# Patient Record
Sex: Male | Born: 1960 | Race: White | Hispanic: No | Marital: Married | Smoking: Never smoker
Health system: Southern US, Community
[De-identification: ages and names within clinical notes are randomized; demographics above are authoritative.]

## PROBLEM LIST (undated history)

## (undated) DIAGNOSIS — E119 Type 2 diabetes mellitus without complications: Secondary | ICD-10-CM

## (undated) DIAGNOSIS — I10 Essential (primary) hypertension: Secondary | ICD-10-CM

## (undated) DIAGNOSIS — E785 Hyperlipidemia, unspecified: Secondary | ICD-10-CM

## (undated) DIAGNOSIS — R809 Proteinuria, unspecified: Secondary | ICD-10-CM

## (undated) HISTORY — DX: Proteinuria, unspecified: R80.9

## (undated) HISTORY — PX: VASECTOMY: SHX75

## (undated) HISTORY — DX: Type 2 diabetes mellitus without complications: E11.9

## (undated) HISTORY — PX: WISDOM TOOTH EXTRACTION: SHX21

## (undated) HISTORY — DX: Hyperlipidemia, unspecified: E78.5

## (undated) HISTORY — DX: Essential (primary) hypertension: I10

---

## 1998-09-29 ENCOUNTER — Encounter: Admission: RE | Admit: 1998-09-29 | Discharge: 1998-12-28 | Payer: Self-pay | Admitting: Family Medicine

## 2009-10-07 ENCOUNTER — Ambulatory Visit (HOSPITAL_COMMUNITY): Admission: RE | Admit: 2009-10-07 | Discharge: 2009-10-07 | Payer: Self-pay | Admitting: Family Medicine

## 2011-01-29 ENCOUNTER — Emergency Department (HOSPITAL_COMMUNITY)
Admission: EM | Admit: 2011-01-29 | Discharge: 2011-01-29 | Disposition: A | Discharge status: Home / Self Care | Payer: 59 | Attending: Emergency Medicine | Admitting: Emergency Medicine

## 2011-01-29 ENCOUNTER — Emergency Department (HOSPITAL_COMMUNITY): Payer: 59

## 2011-01-29 DIAGNOSIS — M549 Dorsalgia, unspecified: Secondary | ICD-10-CM | POA: Insufficient documentation

## 2011-01-29 DIAGNOSIS — M545 Low back pain, unspecified: Secondary | ICD-10-CM | POA: Insufficient documentation

## 2011-01-29 DIAGNOSIS — M5124 Other intervertebral disc displacement, thoracic region: Secondary | ICD-10-CM | POA: Insufficient documentation

## 2011-01-29 DIAGNOSIS — Z79899 Other long term (current) drug therapy: Secondary | ICD-10-CM | POA: Insufficient documentation

## 2011-01-29 DIAGNOSIS — E119 Type 2 diabetes mellitus without complications: Secondary | ICD-10-CM | POA: Insufficient documentation

## 2011-01-29 DIAGNOSIS — I10 Essential (primary) hypertension: Secondary | ICD-10-CM | POA: Insufficient documentation

## 2011-01-29 DIAGNOSIS — E7800 Pure hypercholesterolemia, unspecified: Secondary | ICD-10-CM | POA: Insufficient documentation

## 2011-01-29 DIAGNOSIS — M5126 Other intervertebral disc displacement, lumbar region: Secondary | ICD-10-CM | POA: Insufficient documentation

## 2011-01-29 DIAGNOSIS — R112 Nausea with vomiting, unspecified: Secondary | ICD-10-CM | POA: Insufficient documentation

## 2011-01-29 LAB — COMPREHENSIVE METABOLIC PANEL
BUN: 10 mg/dL (ref 6–23)
CO2: 24 mEq/L (ref 19–32)
Calcium: 8.9 mg/dL (ref 8.4–10.5)
Creatinine, Ser: 0.73 mg/dL (ref 0.4–1.5)
GFR calc non Af Amer: >60 mL/min (ref >60)
Glucose, Bld: 197 mg/dL — ABNORMAL HIGH (ref 70–99)

## 2011-01-29 LAB — DIFFERENTIAL
Lymphocytes Relative: 13 % (ref 12–46)
Monocytes Absolute: 0.3 10*3/uL (ref 0.1–1.0)
Monocytes Relative: 4 % (ref 3–12)
Neutro Abs: 6.5 10*3/uL (ref 1.7–7.7)

## 2011-01-29 LAB — URINALYSIS, ROUTINE W REFLEX MICROSCOPIC
Ketones, ur: NEGATIVE mg/dL
Leukocytes, UA: NEGATIVE
Nitrite: NEGATIVE
Specific Gravity, Urine: >1.03 — ABNORMAL HIGH (ref 1.005–1.030)
pH: 5.5 (ref 5.0–8.0)

## 2011-01-29 LAB — CBC
HCT: 39.2 % (ref 39.0–52.0)
Hemoglobin: 13.5 g/dL (ref 13.0–17.0)
MCH: 29.5 pg (ref 26.0–34.0)
MCHC: 34.4 g/dL (ref 30.0–36.0)

## 2013-01-15 ENCOUNTER — Other Ambulatory Visit: Payer: Self-pay | Admitting: *Deleted

## 2013-01-15 MED ORDER — GLYBURIDE 5 MG PO TABS: 5.0000 mg | ORAL_TABLET | Freq: Every day | ORAL | Status: DC

## 2013-01-15 MED ORDER — METFORMIN HCL 500 MG PO TABS: 1000.0000 mg | ORAL_TABLET | Freq: Two times a day (BID) | ORAL | Status: DC

## 2013-02-21 ENCOUNTER — Other Ambulatory Visit: Payer: Self-pay | Admitting: Family Medicine

## 2013-03-19 ENCOUNTER — Other Ambulatory Visit: Payer: Self-pay | Admitting: Family Medicine

## 2013-07-21 ENCOUNTER — Other Ambulatory Visit: Payer: Self-pay | Admitting: Family Medicine

## 2013-07-25 ENCOUNTER — Other Ambulatory Visit: Payer: Self-pay | Admitting: Family Medicine

## 2013-08-29 ENCOUNTER — Encounter: Payer: Self-pay | Admitting: *Deleted

## 2013-09-01 ENCOUNTER — Encounter: Payer: Self-pay | Admitting: Family Medicine

## 2013-09-01 ENCOUNTER — Ambulatory Visit (INDEPENDENT_AMBULATORY_CARE_PROVIDER_SITE_OTHER): Payer: BC Managed Care – PPO | Admitting: Family Medicine

## 2013-09-01 VITALS — BP 138/88 | Ht 70.5 in | Wt 191.6 lb

## 2013-09-01 DIAGNOSIS — Z79899 Other long term (current) drug therapy: Secondary | ICD-10-CM

## 2013-09-01 DIAGNOSIS — E782 Mixed hyperlipidemia: Secondary | ICD-10-CM | POA: Insufficient documentation

## 2013-09-01 DIAGNOSIS — E119 Type 2 diabetes mellitus without complications: Secondary | ICD-10-CM | POA: Insufficient documentation

## 2013-09-01 DIAGNOSIS — I10 Essential (primary) hypertension: Secondary | ICD-10-CM | POA: Insufficient documentation

## 2013-09-01 DIAGNOSIS — Z125 Encounter for screening for malignant neoplasm of prostate: Secondary | ICD-10-CM

## 2013-09-01 DIAGNOSIS — E1129 Type 2 diabetes mellitus with other diabetic kidney complication: Secondary | ICD-10-CM

## 2013-09-01 DIAGNOSIS — E785 Hyperlipidemia, unspecified: Secondary | ICD-10-CM | POA: Insufficient documentation

## 2013-09-01 DIAGNOSIS — R809 Proteinuria, unspecified: Secondary | ICD-10-CM

## 2013-09-01 DIAGNOSIS — IMO0001 Reserved for inherently not codable concepts without codable children: Secondary | ICD-10-CM

## 2013-09-01 LAB — HEPATIC FUNCTION PANEL
ALT: 22 U/L (ref 0–53)
Bilirubin, Direct: 0.2 mg/dL (ref 0.0–0.3)
Indirect Bilirubin: 0.4 mg/dL (ref 0.0–0.9)

## 2013-09-01 LAB — POCT GLYCOSYLATED HEMOGLOBIN (HGB A1C): Hemoglobin A1C: 6.5

## 2013-09-01 LAB — BASIC METABOLIC PANEL
BUN: 21 mg/dL (ref 6–23)
CO2: 26 mEq/L (ref 19–32)
Glucose, Bld: 178 mg/dL — ABNORMAL HIGH (ref 70–99)
Potassium: 4.7 mEq/L (ref 3.5–5.3)

## 2013-09-01 LAB — LIPID PANEL
Cholesterol: 101 mg/dL (ref 0–200)
VLDL: 11 mg/dL (ref 0–40)

## 2013-09-01 MED ORDER — ATORVASTATIN CALCIUM 40 MG PO TABS: ORAL_TABLET | ORAL | Status: DC

## 2013-09-01 MED ORDER — METFORMIN HCL 500 MG PO TABS: 1000.0000 mg | ORAL_TABLET | Freq: Two times a day (BID) | ORAL | Status: DC

## 2013-09-01 MED ORDER — GLYBURIDE 5 MG PO TABS: ORAL_TABLET | ORAL | Status: DC

## 2013-09-01 MED ORDER — ENALAPRIL MALEATE 20 MG PO TABS: ORAL_TABLET | ORAL | Status: DC

## 2013-09-01 NOTE — Progress Notes (Signed)
  Subjective:    Patient ID: Grant Torres, male    DOB: [birth year], 52 y.o.   MRN: [id number]  Diabetes He has type 2 diabetes mellitus. His disease course has been improving. Pertinent negatives for diabetes include no blurred vision and no chest pain. Symptoms are stable. There are no diabetic complications. Risk factors for coronary artery disease include diabetes mellitus, hypertension and male sex. Current diabetic treatment includes oral agent (monotherapy). He is compliant with treatment all of the time. He is following a generally healthy diet. Meal planning includes avoidance of concentrated sweets. He participates in exercise intermittently. There is no change in his home blood glucose trend. His breakfast blood glucose is taken between 7-8 am. His breakfast blood glucose range is generally 90-110 mg/dl. An ACE inhibitor/angiotensin II receptor blocker is being taken. Eye exam is not current.   Patient is here today for diabetic check up. He states his blood sugars are WNL.  Patient claims compliance with his blood pressure medication. Does not check his blood pressure elsewhere. Watching his salt intake. No obvious side effects from the medicine. Not exercising much except at work.  Compliant with lipid medications. No obvious side effects. Watching his diet fairly well. Eating healthy.  History of micro-proteinuria.  He needs a refill on meds.   Results for orders placed in visit on 09/01/13  POCT GLYCOSYLATED HEMOGLOBIN (HGB A1C)      Result Value Range   Hemoglobin A1C 6.5        Review of Systems  Eyes: Negative for blurred vision.  Cardiovascular: Negative for chest pain.   no abdominal pain no back pain no numbness or tingling. No frequent urination. No headache no chest pain ROS otherwise negative     Objective:   Physical Exam  Alert HEENT normal. Lungs clear. Heart regular rate and rhythm. Ankles without edema. See diabetic foot exam      Assessment & Plan:   Impression #1 type 2 diabetes control good. #2 hypertension controlled good. #3 hyperlipidemia status uncertain. #4 micro-proteinuria status uncertain. Plan already has had flu shot. Exercise encourage. Diet discussed. See eye Dr. Walking is exam in 3 months. Appropriate blood work. WSL

## 2013-09-02 LAB — PSA: PSA: 1.37 ng/mL (ref <=4.00)

## 2013-09-11 ENCOUNTER — Encounter: Payer: Self-pay | Admitting: Family Medicine

## 2013-12-07 ENCOUNTER — Encounter: Payer: Self-pay | Admitting: Family Medicine

## 2013-12-07 ENCOUNTER — Ambulatory Visit (INDEPENDENT_AMBULATORY_CARE_PROVIDER_SITE_OTHER): Payer: BC Managed Care – PPO | Admitting: Family Medicine

## 2013-12-07 VITALS — BP 130/78 | Ht 70.5 in | Wt 191.8 lb

## 2013-12-07 DIAGNOSIS — Z79899 Other long term (current) drug therapy: Secondary | ICD-10-CM

## 2013-12-07 DIAGNOSIS — E789 Disorder of lipoprotein metabolism, unspecified: Secondary | ICD-10-CM

## 2013-12-07 DIAGNOSIS — Z0000 Encounter for general adult medical examination without abnormal findings: Secondary | ICD-10-CM

## 2013-12-07 DIAGNOSIS — E782 Mixed hyperlipidemia: Secondary | ICD-10-CM

## 2013-12-07 NOTE — Progress Notes (Signed)
   Subjective:    Patient ID: Grant Torres, male    DOB: 1961-03-20, 53 y.o.   MRN: [id number]  HPI  Patient arrives for a yearly physical. Patient reports no problems or concerns.  stying very busy, working sixty hrs per week  Overtime is very significant,business high volume currently  Likely got a pneum booster, pretty  Sure go one  Gets flu shots regularly,  No close fam hx of colon ca or prostate ca  No colonospy yet,  Exercised with tennis and b ball in thre past, wife works at [employer] center, no exercise equip    Review of Systems  Constitutional: Negative for fever, activity change and appetite change.  HENT: Negative for congestion and rhinorrhea.   Eyes: Negative for discharge.  Respiratory: Negative for cough and wheezing.   Cardiovascular: Negative for chest pain.  Gastrointestinal: Negative for vomiting, abdominal pain and blood in stool.  Genitourinary: Negative for frequency and difficulty urinating.  Musculoskeletal: Negative for neck pain.  Skin: Negative for rash.  Allergic/Immunologic: Negative for environmental allergies and food allergies.  Neurological: Negative for weakness and headaches.  Psychiatric/Behavioral: Negative for agitation.       Objective:   Physical Exam  Constitutional: He appears well-developed and well-nourished.  HENT:  Head: Normocephalic and atraumatic.  Right Ear: External ear normal.  Left Ear: External ear normal.  Nose: Nose normal.  Mouth/Throat: Oropharynx is clear and moist.  Eyes: EOM are normal. Pupils are equal, round, and reactive to light.  Neck: Normal range of motion. Neck supple. No thyromegaly present.  Cardiovascular: Normal rate, regular rhythm and normal heart sounds.   No murmur heard. Pulmonary/Chest: Effort normal and breath sounds normal. No respiratory distress. He has no wheezes.  Abdominal: Soft. Bowel sounds are normal. He exhibits no distension and no mass. There is no tenderness.    Genitourinary: Prostate normal and penis normal.  Musculoskeletal: Normal range of motion. He exhibits no edema.  Lymphadenopathy:    He has no cervical adenopathy.  Neurological: He is alert. He exhibits normal muscle tone.  Skin: Skin is warm and dry. No erythema.  Psychiatric: He has a normal mood and affect. His behavior is normal. Judgment normal.          Assessment & Plan:  Impression 1 wellness exam #2 hypertension #3 type 2 diabetes #4 hyperlipidemia plan diet discussed at length. Exercise discussed length. Patient reports he just had another pneumonia shot several years ago in [place]. GI sheet given patient advised to call and set up colonoscopy. Recheck in several months. WSL

## 2014-03-09 ENCOUNTER — Ambulatory Visit (INDEPENDENT_AMBULATORY_CARE_PROVIDER_SITE_OTHER): Payer: BC Managed Care – PPO | Admitting: Family Medicine

## 2014-03-09 ENCOUNTER — Encounter: Payer: Self-pay | Admitting: Family Medicine

## 2014-03-09 VITALS — BP 128/74 | Ht 70.5 in | Wt 193.0 lb

## 2014-03-09 DIAGNOSIS — E1165 Type 2 diabetes mellitus with hyperglycemia: Secondary | ICD-10-CM

## 2014-03-09 DIAGNOSIS — R809 Proteinuria, unspecified: Secondary | ICD-10-CM

## 2014-03-09 DIAGNOSIS — E119 Type 2 diabetes mellitus without complications: Secondary | ICD-10-CM

## 2014-03-09 DIAGNOSIS — I10 Essential (primary) hypertension: Secondary | ICD-10-CM

## 2014-03-09 DIAGNOSIS — IMO0002 Reserved for concepts with insufficient information to code with codable children: Secondary | ICD-10-CM

## 2014-03-09 DIAGNOSIS — E118 Type 2 diabetes mellitus with unspecified complications: Secondary | ICD-10-CM

## 2014-03-09 DIAGNOSIS — E785 Hyperlipidemia, unspecified: Secondary | ICD-10-CM

## 2014-03-09 LAB — POCT GLYCOSYLATED HEMOGLOBIN (HGB A1C): HEMOGLOBIN A1C: 7.8

## 2014-03-09 MED ORDER — METFORMIN HCL 500 MG PO TABS: 1000.0000 mg | ORAL_TABLET | Freq: Two times a day (BID) | ORAL | Status: DC

## 2014-03-09 MED ORDER — ATORVASTATIN CALCIUM 40 MG PO TABS: ORAL_TABLET | ORAL | Status: DC

## 2014-03-09 MED ORDER — GLYBURIDE 5 MG PO TABS: ORAL_TABLET | ORAL | Status: DC

## 2014-03-09 MED ORDER — ENALAPRIL MALEATE 20 MG PO TABS: ORAL_TABLET | ORAL | Status: DC

## 2014-03-09 NOTE — Progress Notes (Signed)
   Subjective:    Patient ID: Grant Torres, male    DOB: 10/14/1961, 53 y.o.   MRN: [id number]  Diabetes He presents for his follow-up diabetic visit. He has type 2 diabetes mellitus. He is compliant with treatment all of the time. Exercise: walks two days a week. His breakfast blood glucose range is generally 110-130 mg/dl. He does not see a podiatrist.Eye exam is current.  no low sugar levels Results for orders placed in visit on 03/09/14  POCT GLYCOSYLATED HEMOGLOBIN (HGB A1C)      Result Value Ref Range   Hemoglobin A1C 7.8      Numb about the same 110 to 120, somewhere in there. cks bef eating  Sticking with  Exercise walking couple days per wk  Works long hours but off and on,  Eats lighter during the summer  Couple bouts of head cold and cong   Compliant with lipid medications. No obvious side effects. Sticking with his diet mostly. But not perfectly.  Compliant with blood pressure medicine. No obvious side effects. Generally does not miss a dose.  No significant low sugar spells.    Review of Systems No chest pain no headache no back pain no weight loss weight gain ROS otherwise negative    Objective:   Physical Exam Alert no apparent distress HEENT normal neck supple. Lungs clear heart regular in rhythm. Ankles without edema.       Assessment & Plan:  Impression 1 type 2 diabetes suboptimum discuss. #2 hypertension good control. #3 hyperlipidemia status uncertain. Plan diet exercise discussed. Increase dose of glyburide. Yearly eye Dr. visits encourage. Medications refilled. Appropriate blood work. Further recommendations based results. WSL

## 2014-03-10 LAB — LIPID PANEL
CHOLESTEROL: 100 mg/dL (ref 0–200)
HDL: 31 mg/dL — ABNORMAL LOW (ref >39)
LDL Cholesterol: 45 mg/dL (ref 0–99)
Total CHOL/HDL Ratio: 3.2 Ratio
Triglycerides: 119 mg/dL (ref <150)
VLDL: 24 mg/dL (ref 0–40)

## 2014-03-10 LAB — HEPATIC FUNCTION PANEL
ALK PHOS: 52 U/L (ref 39–117)
ALT: 24 U/L (ref 0–53)
AST: 20 U/L (ref 0–37)
Albumin: 4.2 g/dL (ref 3.5–5.2)
BILIRUBIN DIRECT: 0.1 mg/dL (ref 0.0–0.3)
BILIRUBIN TOTAL: 0.5 mg/dL (ref 0.2–1.2)
Indirect Bilirubin: 0.4 mg/dL (ref 0.2–1.2)
Total Protein: 6.6 g/dL (ref 6.0–8.3)

## 2014-03-12 ENCOUNTER — Other Ambulatory Visit: Payer: Self-pay | Admitting: Family Medicine

## 2014-03-14 ENCOUNTER — Encounter: Payer: Self-pay | Admitting: Family Medicine

## 2014-09-08 ENCOUNTER — Other Ambulatory Visit: Payer: Self-pay | Admitting: Family Medicine

## 2014-10-04 ENCOUNTER — Other Ambulatory Visit: Payer: Self-pay | Admitting: Family Medicine

## 2014-10-10 ENCOUNTER — Other Ambulatory Visit: Payer: Self-pay | Admitting: Family Medicine

## 2014-10-20 ENCOUNTER — Ambulatory Visit (INDEPENDENT_AMBULATORY_CARE_PROVIDER_SITE_OTHER): Payer: BC Managed Care – PPO | Admitting: Family Medicine

## 2014-10-20 ENCOUNTER — Encounter: Payer: Self-pay | Admitting: Family Medicine

## 2014-10-20 ENCOUNTER — Encounter (INDEPENDENT_AMBULATORY_CARE_PROVIDER_SITE_OTHER): Payer: Self-pay

## 2014-10-20 VITALS — BP 130/80 | Ht 70.5 in | Wt 194.4 lb

## 2014-10-20 DIAGNOSIS — E119 Type 2 diabetes mellitus without complications: Secondary | ICD-10-CM

## 2014-10-20 DIAGNOSIS — Z125 Encounter for screening for malignant neoplasm of prostate: Secondary | ICD-10-CM

## 2014-10-20 DIAGNOSIS — E785 Hyperlipidemia, unspecified: Secondary | ICD-10-CM

## 2014-10-20 DIAGNOSIS — Z79899 Other long term (current) drug therapy: Secondary | ICD-10-CM

## 2014-10-20 LAB — HEPATIC FUNCTION PANEL
ALT: 36 U/L (ref 0–53)
AST: 26 U/L (ref 0–37)
Albumin: 4.4 g/dL (ref 3.5–5.2)
Alkaline Phosphatase: 56 U/L (ref 39–117)
BILIRUBIN DIRECT: 0.2 mg/dL (ref 0.0–0.3)
Indirect Bilirubin: 0.4 mg/dL (ref 0.2–1.2)
Total Bilirubin: 0.6 mg/dL (ref 0.2–1.2)
Total Protein: 7 g/dL (ref 6.0–8.3)

## 2014-10-20 LAB — BASIC METABOLIC PANEL
BUN: 15 mg/dL (ref 6–23)
CO2: 27 mEq/L (ref 19–32)
Calcium: 9.5 mg/dL (ref 8.4–10.5)
Chloride: 100 mEq/L (ref 96–112)
Creat: 0.77 mg/dL (ref 0.50–1.35)
Glucose, Bld: 224 mg/dL — ABNORMAL HIGH (ref 70–99)
POTASSIUM: 4.3 meq/L (ref 3.5–5.3)
SODIUM: 140 meq/L (ref 135–145)

## 2014-10-20 LAB — POCT GLYCOSYLATED HEMOGLOBIN (HGB A1C): Hemoglobin A1C: 8

## 2014-10-20 LAB — LIPID PANEL
CHOL/HDL RATIO: 2.9 ratio
Cholesterol: 97 mg/dL (ref 0–200)
HDL: 33 mg/dL — ABNORMAL LOW (ref >39)
LDL Cholesterol: 44 mg/dL (ref 0–99)
TRIGLYCERIDES: 98 mg/dL (ref <150)
VLDL: 20 mg/dL (ref 0–40)

## 2014-10-20 MED ORDER — ENALAPRIL MALEATE 20 MG PO TABS: 20.0000 mg | ORAL_TABLET | Freq: Every day | ORAL | Status: DC

## 2014-10-20 MED ORDER — METFORMIN HCL 500 MG PO TABS: ORAL_TABLET | ORAL | Status: DC

## 2014-10-20 MED ORDER — ATORVASTATIN CALCIUM 40 MG PO TABS: 20.0000 mg | ORAL_TABLET | Freq: Every day | ORAL | Status: DC

## 2014-10-20 MED ORDER — GLYBURIDE 5 MG PO TABS
ORAL_TABLET | ORAL | Status: DC
Start: 2014-10-20 — End: 2014-11-11

## 2014-10-20 NOTE — Progress Notes (Signed)
   Subjective:    Patient ID: Grant Torres, male    DOB: 1961/03/04, 53 y.o.   MRN: [id number]  Diabetes He presents for his follow-up diabetic visit. He has type 2 diabetes mellitus. His disease course has been stable. There are no hypoglycemic associated symptoms. There are no diabetic associated symptoms. There are no hypoglycemic complications. Symptoms are stable. There are no diabetic complications. There are no known risk factors for coronary artery disease. Current diabetic treatment includes oral agent (dual therapy). He is compliant with treatment all of the time.   Patient states that he has had a persistent cough for over 1 week now.   Sig cough worse at night  occas productive, more like a tickle than a bad chest cough  Walking at work  Diet fairly tight, not too much indulgence, eats a lot of salad  Results for orders placed or performed in visit on 10/20/14  POCT glycosylated hemoglobin (Hb A1C)  Result Value Ref Range   Hemoglobin A1C 8.0   most morn numb are a110 to 120  Eye doc soon in [place] with blood pressure medication. Does not miss a dose. Watching salt intake. Unfortunately not exercising.  Compliant with lipid medicine. No obvious side effects. Has watch cholesterol on diet  Slight cough no headache no fever no chills on week's duration   . Review of Systems  no headache no chest pain no back pain no abdominal pain no change in bowel habits no blood in stool ROS otherwise negative    Objective:   Physical Exam   alert no acute distress blood pressure good on repeat HEENT sinus congestion frontal neck supple lungs clear. Heart regular in rhythm. C diabetic foot exam       Assessment & Plan:   Impression type 2 diabetes control suboptimal discussed #2 hypertension good control. #3 hyperlipidemia status uncertain. #4 URI plan increase DiaBeta diabetic rationale discussed appropriate blood work. Check in 6 months we'll do wellness plus diabetes  visit if his insurance allows for this. Maintain other meds. Further recommendations based on lipid result. If no improvement and cough can call for antibiotics next week WSL

## 2014-10-21 LAB — PSA: PSA: 2.23 ng/mL (ref <=4.00)

## 2014-10-21 LAB — MICROALBUMIN, URINE: Microalb, Ur: 3.6 mg/dL — ABNORMAL HIGH (ref <2.0)

## 2014-10-24 ENCOUNTER — Encounter: Payer: Self-pay | Admitting: Family Medicine

## 2014-11-11 ENCOUNTER — Other Ambulatory Visit: Payer: Self-pay | Admitting: Family Medicine

## 2015-01-26 ENCOUNTER — Other Ambulatory Visit: Payer: Self-pay | Admitting: Family Medicine

## 2015-01-27 MED ORDER — GLYBURIDE 5 MG PO TABS: ORAL_TABLET | ORAL | Status: DC

## 2015-01-27 NOTE — Addendum Note (Signed)
Addended byCharolotte Capuchin D on: 01/27/2015 10:48 AM   Modules accepted: Orders

## 2015-01-30 ENCOUNTER — Other Ambulatory Visit: Payer: Self-pay | Admitting: Family Medicine

## 2015-04-21 ENCOUNTER — Ambulatory Visit (INDEPENDENT_AMBULATORY_CARE_PROVIDER_SITE_OTHER): Payer: BLUE CROSS/BLUE SHIELD | Admitting: Family Medicine

## 2015-04-21 ENCOUNTER — Encounter: Payer: Self-pay | Admitting: Family Medicine

## 2015-04-21 VITALS — BP 120/80 | Ht 70.5 in | Wt 194.2 lb

## 2015-04-21 DIAGNOSIS — E119 Type 2 diabetes mellitus without complications: Secondary | ICD-10-CM | POA: Diagnosis not present

## 2015-04-21 DIAGNOSIS — E785 Hyperlipidemia, unspecified: Secondary | ICD-10-CM | POA: Diagnosis not present

## 2015-04-21 DIAGNOSIS — Z79899 Other long term (current) drug therapy: Secondary | ICD-10-CM | POA: Diagnosis not present

## 2015-04-21 DIAGNOSIS — I10 Essential (primary) hypertension: Secondary | ICD-10-CM | POA: Diagnosis not present

## 2015-04-21 LAB — POCT GLYCOSYLATED HEMOGLOBIN (HGB A1C): Hemoglobin A1C: 7.9

## 2015-04-21 MED ORDER — METFORMIN HCL 500 MG PO TABS: ORAL_TABLET | ORAL | Status: DC

## 2015-04-21 MED ORDER — ATORVASTATIN CALCIUM 40 MG PO TABS: 20.0000 mg | ORAL_TABLET | Freq: Every day | ORAL | Status: DC

## 2015-04-21 MED ORDER — ENALAPRIL MALEATE 20 MG PO TABS: 20.0000 mg | ORAL_TABLET | Freq: Every day | ORAL | Status: DC

## 2015-04-21 MED ORDER — GLYBURIDE 5 MG PO TABS: ORAL_TABLET | ORAL | Status: DC

## 2015-04-21 NOTE — Progress Notes (Signed)
   Subjective:    Patient ID: Grant Torres, male    DOB: 02/24/1961, 54 y.o.   MRN: [id number]  Diabetes He presents for his follow-up diabetic visit. He has type 2 diabetes mellitus. There are no hypoglycemic associated symptoms. There are no diabetic associated symptoms. There are no hypoglycemic complications. There are no diabetic complications. There are no known risk factors for coronary artery disease. Current diabetic treatment includes oral agent (dual therapy). He is compliant with treatment all of the time.   Patient states that he has no concerns at this time.   During the summer eats meds regularly  stickes with meds and   110 and 120 mostly in the morn..  No low sugars recently  No longer skipping morn meds    Results for orders placed or performed in visit on 04/21/15  POCT glycosylated hemoglobin (Hb A1C)  Result Value Ref Range   Hemoglobin A1C 7.9    Had diab visit in jan for the eyes, stable per pt.  Patient arrives office for follow-up of multiple concerns. Claims compliance with blood pressure medication. Generally does not miss. No obvious side effects. Has cut his salt intake down.  Compliant with lipid medicine. No obvious side effects.  Exercise mostly not occurring due to tremendously busy schedule   Review of Systems No headache no chest pain no back pain no abdominal pain no change in bowel habits no blood in stool    Objective:   Physical Exam  Alert vitals stable HEENT normal. Lungs clear. Heart regular in rhythm. Ankles without edema      Assessment & Plan:  Impression 1 type 2 diabetes control suboptimal in discussed #2 hypertension good control discussed #3 hyperlipidemia status uncertain discuss plan appropriate blood work. Diet exercise discussed. Recheck in 6 months for both wellness and diabetes visit. Increase glyburide to one and half tablets twice a day WSL

## 2015-04-22 LAB — HEPATIC FUNCTION PANEL
ALBUMIN: 4.6 g/dL (ref 3.5–5.5)
ALK PHOS: 59 IU/L (ref 39–117)
ALT: 31 IU/L (ref 0–44)
AST: 25 IU/L (ref 0–40)
BILIRUBIN TOTAL: 0.8 mg/dL (ref 0.0–1.2)
BILIRUBIN, DIRECT: 0.25 mg/dL (ref 0.00–0.40)
Total Protein: 7 g/dL (ref 6.0–8.5)

## 2015-04-22 LAB — LIPID PANEL
CHOL/HDL RATIO: 3.1 ratio (ref 0.0–5.0)
Cholesterol, Total: 113 mg/dL (ref 100–199)
HDL: 37 mg/dL — AB (ref >39)
LDL CALC: 50 mg/dL (ref 0–99)
TRIGLYCERIDES: 130 mg/dL (ref 0–149)
VLDL Cholesterol Cal: 26 mg/dL (ref 5–40)

## 2015-04-25 ENCOUNTER — Encounter: Payer: Self-pay | Admitting: Family Medicine

## 2015-10-04 ENCOUNTER — Other Ambulatory Visit: Payer: Self-pay | Admitting: Family Medicine

## 2015-10-26 ENCOUNTER — Encounter: Payer: Self-pay | Admitting: Family Medicine

## 2015-10-26 ENCOUNTER — Ambulatory Visit (INDEPENDENT_AMBULATORY_CARE_PROVIDER_SITE_OTHER): Payer: BLUE CROSS/BLUE SHIELD | Admitting: Family Medicine

## 2015-10-26 VITALS — BP 128/82 | Ht 70.5 in | Wt 189.4 lb

## 2015-10-26 DIAGNOSIS — E785 Hyperlipidemia, unspecified: Secondary | ICD-10-CM

## 2015-10-26 DIAGNOSIS — E119 Type 2 diabetes mellitus without complications: Secondary | ICD-10-CM | POA: Diagnosis not present

## 2015-10-26 DIAGNOSIS — Z0000 Encounter for general adult medical examination without abnormal findings: Secondary | ICD-10-CM

## 2015-10-26 DIAGNOSIS — Z125 Encounter for screening for malignant neoplasm of prostate: Secondary | ICD-10-CM

## 2015-10-26 DIAGNOSIS — Z23 Encounter for immunization: Secondary | ICD-10-CM | POA: Diagnosis not present

## 2015-10-26 DIAGNOSIS — I10 Essential (primary) hypertension: Secondary | ICD-10-CM | POA: Diagnosis not present

## 2015-10-26 DIAGNOSIS — Z79899 Other long term (current) drug therapy: Secondary | ICD-10-CM | POA: Diagnosis not present

## 2015-10-26 LAB — POCT GLYCOSYLATED HEMOGLOBIN (HGB A1C): Hemoglobin A1C: 7.5

## 2015-10-26 MED ORDER — ATORVASTATIN CALCIUM 40 MG PO TABS: 20.0000 mg | ORAL_TABLET | Freq: Every day | ORAL | Status: DC

## 2015-10-26 MED ORDER — ENALAPRIL MALEATE 20 MG PO TABS: 20.0000 mg | ORAL_TABLET | Freq: Every day | ORAL | Status: DC

## 2015-10-26 MED ORDER — GLYBURIDE 5 MG PO TABS: ORAL_TABLET | ORAL | Status: DC

## 2015-10-26 MED ORDER — METFORMIN HCL 500 MG PO TABS: ORAL_TABLET | ORAL | Status: DC

## 2015-10-26 NOTE — Progress Notes (Signed)
   Subjective:    Patient ID: Grant Torres, male    DOB: Nov 22, 1960, 54 y.o.   MRN: [id number]  HPI The patient comes in today for a wellness visit.  Has not had colonoscopy, difficult to schedule it,  rxercise so so, not the best , staying active with work   A review of their health history was completed.  A review of medications was also completed.  Any needed refills; yes-90 days  Eating habits: trying to eat good  Falls/  MVA accidents in past few months: no  Regular exercise: just active at work  Specialist pt sees on regular basis: no  Preventative health issues were discussed.   Additional concerns:none Results for orders placed or performed in visit on 10/26/15  POCT glycosylated hemoglobin (Hb A1C)  Result Value Ref Range   Hemoglobin A1C 7.5    Most numbers between 110 ish,, no sig low sugar spells lately   Eye doc sched for 21of January  Flu shot needs today   Overall diet grade around a "B"  nees b w, also .      Review of Systems  Constitutional: Negative for fever, activity change and appetite change.  HENT: Negative for congestion and rhinorrhea.   Eyes: Negative for discharge.  Respiratory: Negative for cough and wheezing.   Cardiovascular: Negative for chest pain.  Gastrointestinal: Negative for vomiting, abdominal pain and blood in stool.  Genitourinary: Negative for frequency and difficulty urinating.  Musculoskeletal: Negative for neck pain.  Skin: Negative for rash.  Allergic/Immunologic: Negative for environmental allergies and food allergies.  Neurological: Negative for weakness and headaches.  Psychiatric/Behavioral: Negative for agitation.  All other systems reviewed and are negative.      Objective:   Physical Exam  Constitutional: He appears well-developed and well-nourished.  HENT:  Head: Normocephalic and atraumatic.  Right Ear: External ear normal.  Left Ear: External ear normal.  Nose: Nose normal.  Mouth/Throat:  Oropharynx is clear and moist.  Eyes: EOM are normal. Pupils are equal, round, and reactive to light.  Neck: Normal range of motion. Neck supple. No thyromegaly present.  Cardiovascular: Normal rate, regular rhythm and normal heart sounds.   No murmur heard. Pulmonary/Chest: Effort normal and breath sounds normal. No respiratory distress. He has no wheezes.  Abdominal: Soft. Bowel sounds are normal. He exhibits no distension and no mass. There is no tenderness.  Genitourinary: Penis normal.  Musculoskeletal: Normal range of motion. He exhibits no edema.  Lymphadenopathy:    He has no cervical adenopathy.  Neurological: He is alert. He exhibits normal muscle tone.  Skin: Skin is warm and dry. No erythema.  Psychiatric: He has a normal mood and affect. His behavior is normal. Judgment normal.  Vitals reviewed.         Assessment & Plan:  Impression well adult exam. #2 type 2 diabetes control good the not perfect discussed at length #3 hypertension good control plan appropriate blood work. Flu shot. Colonoscopy sheet given strongly encouraged to get on with it. Meds reviewed. Recheck in 6 months WSL

## 2015-10-27 LAB — HEPATIC FUNCTION PANEL
ALT: 22 IU/L (ref 0–44)
AST: 19 IU/L (ref 0–40)
Albumin: 4.8 g/dL (ref 3.5–5.5)
Alkaline Phosphatase: 59 IU/L (ref 39–117)
BILIRUBIN, DIRECT: 0.16 mg/dL (ref 0.00–0.40)
Bilirubin Total: 0.5 mg/dL (ref 0.0–1.2)
TOTAL PROTEIN: 7.1 g/dL (ref 6.0–8.5)

## 2015-10-27 LAB — LIPID PANEL
CHOL/HDL RATIO: 3.1 ratio (ref 0.0–5.0)
Cholesterol, Total: 122 mg/dL (ref 100–199)
HDL: 39 mg/dL — ABNORMAL LOW (ref >39)
LDL Calculated: 63 mg/dL (ref 0–99)
Triglycerides: 100 mg/dL (ref 0–149)
VLDL CHOLESTEROL CAL: 20 mg/dL (ref 5–40)

## 2015-10-27 LAB — BASIC METABOLIC PANEL
BUN/Creatinine Ratio: 20 (ref 9–20)
BUN: 15 mg/dL (ref 6–24)
CO2: 21 mmol/L (ref 18–29)
CREATININE: 0.76 mg/dL (ref 0.76–1.27)
Calcium: 9.8 mg/dL (ref 8.7–10.2)
Chloride: 96 mmol/L (ref 96–106)
GFR calc Af Amer: 120 mL/min/{1.73_m2} (ref >59)
GFR calc non Af Amer: 104 mL/min/{1.73_m2} (ref >59)
GLUCOSE: 279 mg/dL — AB (ref 65–99)
Potassium: 4.6 mmol/L (ref 3.5–5.2)
Sodium: 141 mmol/L (ref 134–144)

## 2015-10-27 LAB — PSA: Prostate Specific Ag, Serum: 1.9 ng/mL (ref 0.0–4.0)

## 2015-10-27 LAB — MICROALBUMIN / CREATININE URINE RATIO
CREATININE, UR: 91.2 mg/dL
MICROALB/CREAT RATIO: 32 mg/g{creat} — AB (ref 0.0–30.0)
MICROALBUM., U, RANDOM: 29.2 ug/mL

## 2015-11-02 ENCOUNTER — Encounter: Payer: Self-pay | Admitting: Family Medicine

## 2015-11-02 ENCOUNTER — Ambulatory Visit (INDEPENDENT_AMBULATORY_CARE_PROVIDER_SITE_OTHER): Payer: BLUE CROSS/BLUE SHIELD | Admitting: Family Medicine

## 2015-11-02 ENCOUNTER — Telehealth: Payer: Self-pay | Admitting: Family Medicine

## 2015-11-02 VITALS — BP 118/80 | Temp 98.2°F | Ht 70.5 in | Wt 189.1 lb

## 2015-11-02 DIAGNOSIS — G542 Cervical root disorders, not elsewhere classified: Secondary | ICD-10-CM

## 2015-11-02 DIAGNOSIS — R29898 Other symptoms and signs involving the musculoskeletal system: Secondary | ICD-10-CM

## 2015-11-02 MED ORDER — HYDROCODONE-ACETAMINOPHEN 5-325 MG PO TABS: ORAL_TABLET | ORAL | Status: DC

## 2015-11-02 MED ORDER — PREDNISONE 20 MG PO TABS: ORAL_TABLET | ORAL | Status: DC

## 2015-11-02 NOTE — Telephone Encounter (Signed)
Note faxed, pt aware

## 2015-11-02 NOTE — Telephone Encounter (Signed)
That's ok, we need MRI before then and we will work on that too

## 2015-11-02 NOTE — Telephone Encounter (Signed)
Pt would like a work excuse if possible starting  1/4 - 1/17 returning the 18th  Unless you would like  Or feel he needs to stay out longer than past his MRI?

## 2015-11-02 NOTE — Progress Notes (Signed)
   Subjective:    Patient ID: Grant Torres, male    DOB: Nov 24, 1960, 55 y.o.   MRN: [id number]  Back Pain This is a new problem. The current episode started in the past 7 days. The problem occurs constantly. The problem has been gradually worsening since onset. The pain is present in the thoracic spine. The quality of the pain is described as aching and burning. Radiates to: Right arm. The pain is at a severity of 10/10. The pain is severe. The pain is the same all the time. The symptoms are aggravated by position. He has tried ice, chiropractic manipulation and analgesics (Chiropractor, ice, otc pain medications, massage therapy) for the symptoms. The treatment provided no relief.   Patient in today with upper back pain. Patient states pain started on Thursday 10/27/15. Patient has tried Publishing rights manager and was told it was a pinched nerve.   States no other concerns this visit.  Post shoulder pain Extreme  Serious pain  Rad from post shoulder all the way out to the hand  Involving fingers with numbness  Saw chiro and massage theapist   Didn't help , hurting bad,  t ! Pinched nerve oper chiroprac  Taking two otc naproxen q eight hrs   Also feels that right arm is becoming progressively weak. Review of Systems  Musculoskeletal: Positive for back pain.   No headache no chest pain no shortness breath no abdominal pain    Objective:   Physical Exam   alert moderate distress vital stable lungs clear heart rare rhythm neck supple shoulder good range of motion no focal shoulder tenderness triceps muscle appears weak. Sensation diminished lateral arm points suprascapular region is most painful area no tenderness to palpation there. Distal forefinger and middle finger diminished sensation lungs clear heart rare rhythm      Assessment & Plan:   impression C7 neuropathy relatively acute with evidence of triceps weakness and significant numbness extending into hand has tried  chiropractor without success plan prednisone taper hydrocodone when necessary for pain. MRI. We'll likely need neurosurgery referral await results many questions answered easily 25 minutes spent most in discussion

## 2015-11-07 ENCOUNTER — Telehealth: Payer: Self-pay | Admitting: Family Medicine

## 2015-11-07 ENCOUNTER — Other Ambulatory Visit: Payer: Self-pay

## 2015-11-07 MED ORDER — HYDROCODONE-ACETAMINOPHEN 5-325 MG PO TABS
ORAL_TABLET | ORAL | Status: DC
Start: 2015-11-07 — End: 2015-11-21

## 2015-11-07 NOTE — Telephone Encounter (Signed)
Spoke with Dr.Steve Luking in real time and was given orders for refill on hydrocodone #50. Called patient's wife and informed her per Dr.Steve Luking-refill was ready for pick patient wife verbalized understanding. Also informed patient's wife that we are waiting for pre cert for MRI. Patient's wife verbalized understanding.

## 2015-11-07 NOTE — Telephone Encounter (Signed)
Patient will be out of his HYDROcodone-acetaminophen (NORCO/VICODIN) 5-325 MG tablet by the end of today and Grant Torres is requesting a refill. He isn't scheduled to have MRI until 11/15/2015, but someone was looking into getting MRI faster, but haven't heard anything back yet.

## 2015-11-08 ENCOUNTER — Telehealth: Payer: Self-pay | Admitting: Family Medicine

## 2015-11-08 NOTE — Telephone Encounter (Signed)
Spoke with patient's wife and informed her that MRI was denied, and informed her per Dr.Steve Luking follow up office visit for tomorrow was recommended. Patient wife verbalized understanding and was transferred to front desk to schedule appointment with Dr.Steve Luking tomorrow.

## 2015-11-08 NOTE — Telephone Encounter (Signed)
Called and left message for patient to return call. (MRI was denied. Dr.Steve would like for patient to schedule office visit today or tomorrow.

## 2015-11-09 ENCOUNTER — Ambulatory Visit (INDEPENDENT_AMBULATORY_CARE_PROVIDER_SITE_OTHER): Payer: BLUE CROSS/BLUE SHIELD | Admitting: Family Medicine

## 2015-11-09 ENCOUNTER — Telehealth: Payer: Self-pay | Admitting: Family Medicine

## 2015-11-09 ENCOUNTER — Encounter: Payer: Self-pay | Admitting: Family Medicine

## 2015-11-09 ENCOUNTER — Ambulatory Visit (HOSPITAL_COMMUNITY)
Admission: RE | Admit: 2015-11-09 | Discharge: 2015-11-09 | Disposition: A | Discharge status: Home / Self Care | Payer: BLUE CROSS/BLUE SHIELD | Source: Ambulatory Visit | Attending: Family Medicine | Admitting: Family Medicine

## 2015-11-09 VITALS — Ht 70.5 in | Wt 189.0 lb

## 2015-11-09 DIAGNOSIS — M50322 Other cervical disc degeneration at C5-C6 level: Secondary | ICD-10-CM | POA: Diagnosis not present

## 2015-11-09 DIAGNOSIS — M542 Cervicalgia: Secondary | ICD-10-CM | POA: Diagnosis not present

## 2015-11-09 DIAGNOSIS — M2578 Osteophyte, vertebrae: Secondary | ICD-10-CM | POA: Insufficient documentation

## 2015-11-09 DIAGNOSIS — R200 Anesthesia of skin: Secondary | ICD-10-CM | POA: Diagnosis not present

## 2015-11-09 DIAGNOSIS — Z029 Encounter for administrative examinations, unspecified: Secondary | ICD-10-CM

## 2015-11-09 NOTE — Telephone Encounter (Signed)
Ok

## 2015-11-09 NOTE — Progress Notes (Signed)
   Subjective:    Patient ID: Grant Torres, male    DOB: 04-06-1961, 55 y.o.   MRN: [id number]  HPI  Patient arrives for a recheck on neck pain and to discuss denial of MRI by insurance. Patient has ongoing challenges with severe neck pain. Severe shoulder pain. Weakness in his right arm. States that he feels weakness is worse since first seen last week  The pain is constant aching tooth achy in nature., More concerning to the patient is a weakness in his arm.  Numbness in the hand, unable to use his right arm  susstantial pain,, resting better with the pain medicine still substantially painful  Review of Systems No headache no chest pain no abdominal pain no change in bowel habits    Objective:   Physical Exam  Alert vitals stable. HEENT normal neck some pain with lateral rotation right shoulder good range of motion no point tenderness very substantial triceps tenderness with 2 out of 5 strength in that muscle bundle. 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 Long discussion with patient and spouse today. I am frankly shocked that his insurance company is not covering a ASAP cervical spine neuropathy considering the neuromuscular weakness. We went ahead and did a cervical spine x-ray, even though we knew this would not tell us for sure the patient's problem. It did show degenerative disc disease and cervical arthritis disease along with cervical lordosis and loss of disc width. All of this points towards substantial cervical spine disease. We will appeal urgently. Pain medicine use discussed. Local measures discussed. Warning signs discussed WSL

## 2015-11-09 NOTE — Telephone Encounter (Signed)
Patient had disability to send over form to be filled out within 5 days. It has certain questions on the form that physician has to fill out while patient is here.He is being seen at 1:00 today.

## 2015-11-11 ENCOUNTER — Telehealth: Payer: Self-pay | Admitting: Family Medicine

## 2015-11-11 NOTE — Telephone Encounter (Signed)
[facility] (to inform patient still awaiting approval for MRI should have answer from insurance by Monday 11/14/15

## 2015-11-14 ENCOUNTER — Telehealth: Payer: Self-pay | Admitting: Family Medicine

## 2015-11-14 NOTE — Telephone Encounter (Signed)
Patient notified we are still awaiting approval on the appeal for the MRI

## 2015-11-14 NOTE — Telephone Encounter (Signed)
ERROR

## 2015-11-15 ENCOUNTER — Ambulatory Visit (HOSPITAL_COMMUNITY): Payer: BLUE CROSS/BLUE SHIELD

## 2015-11-16 ENCOUNTER — Telehealth: Payer: Self-pay | Admitting: Family Medicine

## 2015-11-16 DIAGNOSIS — M5020 Other cervical disc displacement, unspecified cervical region: Secondary | ICD-10-CM

## 2015-11-16 NOTE — Telephone Encounter (Signed)
Left message to return call 

## 2015-11-16 NOTE — Telephone Encounter (Signed)
Patient would like results of MRI he had done 1/17. Chart in chair.

## 2015-11-16 NOTE — Telephone Encounter (Signed)
Urgent referral placed in EPIC

## 2015-11-16 NOTE — Telephone Encounter (Signed)
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 Need neurosurgeon referral, plz do asap with arm wekness in addtn to pain. Also thyroid conerns, Dr Richardson Landry wants o v with him next wk to discuss that-Verbalized understanding and scheduled office visit for next week to discuss thyroid concerns.

## 2015-11-16 NOTE — Telephone Encounter (Signed)
Nurse call pt, he has a ruptured disc between c6 and c7 pressing on his nerve and a smaller rupture one level above that. Need neurosurg ref, plz do asap with arm wekness in addtn to pain. Also thyroid conerns, i want o v with him next wk to discuss that

## 2015-11-21 ENCOUNTER — Encounter: Payer: Self-pay | Admitting: Family Medicine

## 2015-11-21 ENCOUNTER — Ambulatory Visit (INDEPENDENT_AMBULATORY_CARE_PROVIDER_SITE_OTHER): Payer: BLUE CROSS/BLUE SHIELD | Admitting: Family Medicine

## 2015-11-21 VITALS — BP 108/68 | Ht 70.5 in | Wt 190.1 lb

## 2015-11-21 DIAGNOSIS — G542 Cervical root disorders, not elsewhere classified: Secondary | ICD-10-CM | POA: Diagnosis not present

## 2015-11-21 DIAGNOSIS — R29898 Other symptoms and signs involving the musculoskeletal system: Secondary | ICD-10-CM | POA: Diagnosis not present

## 2015-11-21 DIAGNOSIS — E041 Nontoxic single thyroid nodule: Secondary | ICD-10-CM

## 2015-11-21 MED ORDER — HYDROCODONE-ACETAMINOPHEN 5-325 MG PO TABS
ORAL_TABLET | ORAL | Status: DC
Start: 2015-11-21 — End: 2016-11-13

## 2015-11-21 NOTE — Progress Notes (Signed)
   Subjective:    Patient ID: Grant Torres, male    DOB: 27-Jul-1961, 55 y.o.   MRN: [id number] Patient arrives office with numerous concerns. HPI Patient is here today for a follow up on neck pain and right tricep weakness.pain is severe. Due to have neurosurgery within a week. Triceps weakness. We did MRI confirmed of severe rupture. With substantial C6-C7 involvement.    Patient also states that a problem was detected with his thyroid on the recent MRI. Some family history of thyroid disease patient was not aware had a problem.  Patient brings in his copy of the scan.  Patient needs a refill on his hydrocodone.   Patient has no other concerns at this time.      Review of Systems No headache no chest pain no back pain no abdominal pain ROS otherwise negative    Objective:   Physical Exam Alert vitals stable. Lungs clear heart rhythm substantial discomfort. Right triceps weakness persists thyroid palpable nodule right mid thyroid       Assessment & Plan:  Impression 1 chronic neuropathic pain discussed length #2 pending surgery discussed length #3 thyroid abnormality with cyst complex in nature discussed at length plan we'll press on due ultrasound. Likely may well need further evaluation. Pain medicines written local measures discussed pending surgery discussed 25 minutes spent most in discussion

## 2015-11-25 ENCOUNTER — Ambulatory Visit (HOSPITAL_COMMUNITY): Admission: RE | Admit: 2015-11-25 | Payer: BLUE CROSS/BLUE SHIELD | Source: Ambulatory Visit

## 2015-11-25 HISTORY — PX: CERVICAL SPINE SURGERY: SHX589

## 2015-11-29 LAB — HM DIABETES EYE EXAM

## 2016-01-04 ENCOUNTER — Ambulatory Visit (HOSPITAL_COMMUNITY)
Admission: RE | Admit: 2016-01-04 | Discharge: 2016-01-04 | Disposition: A | Discharge status: Home / Self Care | Payer: BLUE CROSS/BLUE SHIELD | Source: Ambulatory Visit | Attending: Family Medicine | Admitting: Family Medicine

## 2016-01-04 DIAGNOSIS — E041 Nontoxic single thyroid nodule: Secondary | ICD-10-CM | POA: Diagnosis present

## 2016-01-10 ENCOUNTER — Ambulatory Visit (INDEPENDENT_AMBULATORY_CARE_PROVIDER_SITE_OTHER): Payer: BLUE CROSS/BLUE SHIELD | Admitting: Family Medicine

## 2016-01-10 ENCOUNTER — Encounter: Payer: Self-pay | Admitting: Family Medicine

## 2016-01-10 VITALS — BP 136/80 | Ht 70.5 in | Wt 194.4 lb

## 2016-01-10 DIAGNOSIS — E041 Nontoxic single thyroid nodule: Secondary | ICD-10-CM

## 2016-01-10 NOTE — Progress Notes (Signed)
   Subjective:    Patient ID: Grant Torres, male    DOB: Oct 16, 1961, 55 y.o.   MRN: [id number]  HPI Patient is here today for the results of his recent ultrasound. Patient is doing well. Patient has no concerns at this time.   Pt  Arrives for follow-up and discussion.   Status post cervical surgery. Reports improvement in pain. Improvement in right arm strength.    had ultrasound of thyroid. It revealed results as noted below.  Review of Systems  no headache no chest pain no abdominal pain no change in bowel habits    Objective:   Physical Exam   alert vital stable HEENT palpable thyroid now lungs clear heart regular in rhythm.      Assessment & Plan:   impression complex thyroid nodule results discussed with family. A good 6 or 8 questions were asked appropriately about the patient's condition I did my best to answer these questions though I am not a thyroid specialist plan patient needs thin needle biopsy of thyroid which is now done by interventional radiologist. Patient needs blood work. Patient needs a referral definitively for potential surgery in this regard after we get the results back on the thin needle biopsy rationale attempted to explain the patient. Many questions answered. 25 minutes spent most in discussion WSL

## 2016-01-12 ENCOUNTER — Encounter: Payer: Self-pay | Admitting: Family Medicine

## 2016-01-14 LAB — T4: T4, Total: 8.1 ug/dL (ref 4.5–12.0)

## 2016-01-14 LAB — TSH: TSH: 3.88 u[IU]/mL (ref 0.450–4.500)

## 2016-01-14 LAB — T3: T3, Total: 109 ng/dL (ref 71–180)

## 2016-01-18 ENCOUNTER — Ambulatory Visit
Admission: RE | Admit: 2016-01-18 | Discharge: 2016-01-18 | Disposition: A | Discharge status: Home / Self Care | Payer: BLUE CROSS/BLUE SHIELD | Source: Ambulatory Visit | Attending: Family Medicine | Admitting: Family Medicine

## 2016-01-18 ENCOUNTER — Other Ambulatory Visit (HOSPITAL_COMMUNITY)
Admission: RE | Admit: 2016-01-18 | Discharge: 2016-01-18 | Disposition: A | Discharge status: Home / Self Care | Payer: BLUE CROSS/BLUE SHIELD | Source: Ambulatory Visit | Attending: Radiology | Admitting: Radiology

## 2016-01-18 DIAGNOSIS — E041 Nontoxic single thyroid nodule: Secondary | ICD-10-CM | POA: Diagnosis not present

## 2016-01-23 NOTE — Addendum Note (Signed)
Addended by: Dairl Ponder on: 01/23/2016 08:38 AM   Modules accepted: Orders

## 2016-01-25 ENCOUNTER — Encounter: Payer: Self-pay | Admitting: Family Medicine

## 2016-03-01 ENCOUNTER — Other Ambulatory Visit: Payer: Self-pay | Admitting: Endocrinology

## 2016-03-01 DIAGNOSIS — E041 Nontoxic single thyroid nodule: Secondary | ICD-10-CM

## 2016-04-25 ENCOUNTER — Encounter: Payer: Self-pay | Admitting: Family Medicine

## 2016-04-25 ENCOUNTER — Ambulatory Visit (INDEPENDENT_AMBULATORY_CARE_PROVIDER_SITE_OTHER): Payer: BLUE CROSS/BLUE SHIELD | Admitting: Family Medicine

## 2016-04-25 VITALS — BP 122/82 | Ht 70.5 in | Wt 186.0 lb

## 2016-04-25 DIAGNOSIS — E785 Hyperlipidemia, unspecified: Secondary | ICD-10-CM

## 2016-04-25 DIAGNOSIS — E119 Type 2 diabetes mellitus without complications: Secondary | ICD-10-CM

## 2016-04-25 DIAGNOSIS — Z79899 Other long term (current) drug therapy: Secondary | ICD-10-CM

## 2016-04-25 MED ORDER — GLIPIZIDE 5 MG PO TABS: 10.0000 mg | ORAL_TABLET | Freq: Two times a day (BID) | ORAL | Status: DC

## 2016-04-25 MED ORDER — METFORMIN HCL 500 MG PO TABS: ORAL_TABLET | ORAL | Status: DC

## 2016-04-25 MED ORDER — ENALAPRIL MALEATE 20 MG PO TABS: 20.0000 mg | ORAL_TABLET | Freq: Every day | ORAL | Status: DC

## 2016-04-25 MED ORDER — ATORVASTATIN CALCIUM 40 MG PO TABS: 20.0000 mg | ORAL_TABLET | Freq: Every day | ORAL | Status: DC

## 2016-04-25 NOTE — Progress Notes (Signed)
   Subjective:    Patient ID: Grant Torres, male    DOB: Aug 06, 1961, 55 y.o.   MRN: [id number] Patient arrives office with numerous concerns Diabetes He presents for his follow-up diabetic visit. He has type 2 diabetes mellitus. Risk factors for coronary artery disease include diabetes mellitus, dyslipidemia and hypertension. Current diabetic treatment includes oral agent (dual therapy). He is compliant with treatment all of the time. His weight is stable. He is following a diabetic diet.  Most morning sugars running somewhat elevated   Returned to work April ten , now doing better  Thyr now on hormone suplement   Patient compliant with blood pressure medication. No obvious side effects. Watching salt intake. Generally does not miss a dose. Numbers generally good  Compliant with cholesterol medication. Prior blood work results reviewed. Does not miss a dose. No obvious side effects. Working on fats in diet.  Exercise not a lot Review of Systems No headache, no major weight loss or weight gain, no chest pain no back pain abdominal pain no change in bowel habits complete ROS otherwise negative     Objective:   Physical Exam Alert vitals stable. HEENT normal. Lungs clear. Heart regular rate and rhythm. Ankles without edema Diabetic foot exam within normal limits  Results for orders placed or performed in visit on 04/25/16  HM DIABETES EYE EXAM  Result Value Ref Range   HM Diabetic Eye Exam No Retinopathy No Retinopathy       Assessment & Plan:  Impression 1 type 2 diabetes discussed suboptimum, need to increase medication #2 hyperlipidemia prior controlled good maintain same now appropriate blood work may adjust #3 hypertension good control maintain same meds diet exercise discussed plan appropriate blood work. Diet exercise discussed. Medications refilled. Glucotrol adjusted. Recheck in 6 months WSL

## 2016-04-28 LAB — HEPATIC FUNCTION PANEL
ALK PHOS: 64 IU/L (ref 39–117)
ALT: 28 IU/L (ref 0–44)
AST: 23 IU/L (ref 0–40)
Albumin: 4.6 g/dL (ref 3.5–5.5)
BILIRUBIN TOTAL: 1 mg/dL (ref 0.0–1.2)
BILIRUBIN, DIRECT: 0.29 mg/dL (ref 0.00–0.40)
Total Protein: 7 g/dL (ref 6.0–8.5)

## 2016-04-28 LAB — LIPID PANEL
CHOLESTEROL TOTAL: 107 mg/dL (ref 100–199)
Chol/HDL Ratio: 3.1 ratio units (ref 0.0–5.0)
HDL: 35 mg/dL — AB (ref >39)
LDL Calculated: 52 mg/dL (ref 0–99)
TRIGLYCERIDES: 99 mg/dL (ref 0–149)
VLDL Cholesterol Cal: 20 mg/dL (ref 5–40)

## 2016-04-29 ENCOUNTER — Encounter: Payer: Self-pay | Admitting: Family Medicine

## 2016-08-29 ENCOUNTER — Inpatient Hospital Stay: Admission: RE | Admit: 2016-08-29 | Payer: BLUE CROSS/BLUE SHIELD | Source: Ambulatory Visit

## 2016-09-12 ENCOUNTER — Other Ambulatory Visit (HOSPITAL_COMMUNITY): Payer: Self-pay | Admitting: Endocrinology

## 2016-09-12 DIAGNOSIS — E041 Nontoxic single thyroid nodule: Secondary | ICD-10-CM

## 2016-09-18 ENCOUNTER — Ambulatory Visit (HOSPITAL_COMMUNITY)
Admission: RE | Admit: 2016-09-18 | Discharge: 2016-09-18 | Disposition: A | Discharge status: Home / Self Care | Payer: BLUE CROSS/BLUE SHIELD | Source: Ambulatory Visit | Attending: Endocrinology | Admitting: Endocrinology

## 2016-09-18 DIAGNOSIS — E041 Nontoxic single thyroid nodule: Secondary | ICD-10-CM

## 2016-10-25 ENCOUNTER — Encounter: Payer: BLUE CROSS/BLUE SHIELD | Admitting: Family Medicine

## 2016-10-27 ENCOUNTER — Other Ambulatory Visit: Payer: Self-pay | Admitting: Family Medicine

## 2016-11-08 ENCOUNTER — Other Ambulatory Visit: Payer: Self-pay | Admitting: Family Medicine

## 2016-11-13 ENCOUNTER — Encounter: Payer: Self-pay | Admitting: Family Medicine

## 2016-11-13 ENCOUNTER — Ambulatory Visit (INDEPENDENT_AMBULATORY_CARE_PROVIDER_SITE_OTHER): Payer: Managed Care, Other (non HMO) | Admitting: Family Medicine

## 2016-11-13 VITALS — BP 132/84

## 2016-11-13 DIAGNOSIS — Z125 Encounter for screening for malignant neoplasm of prostate: Secondary | ICD-10-CM

## 2016-11-13 DIAGNOSIS — E119 Type 2 diabetes mellitus without complications: Secondary | ICD-10-CM

## 2016-11-13 DIAGNOSIS — E785 Hyperlipidemia, unspecified: Secondary | ICD-10-CM | POA: Diagnosis not present

## 2016-11-13 DIAGNOSIS — Z0000 Encounter for general adult medical examination without abnormal findings: Secondary | ICD-10-CM | POA: Diagnosis not present

## 2016-11-13 DIAGNOSIS — Z79899 Other long term (current) drug therapy: Secondary | ICD-10-CM | POA: Diagnosis not present

## 2016-11-13 LAB — POCT GLYCOSYLATED HEMOGLOBIN (HGB A1C): HEMOGLOBIN A1C: 8.5

## 2016-11-13 MED ORDER — ATORVASTATIN CALCIUM 40 MG PO TABS: 20.0000 mg | ORAL_TABLET | Freq: Every day | ORAL | 1 refills | Status: DC

## 2016-11-13 MED ORDER — EMPAGLIFLOZIN 10 MG PO TABS: 10.0000 mg | ORAL_TABLET | Freq: Every day | ORAL | 0 refills | Status: DC

## 2016-11-13 MED ORDER — METFORMIN HCL 500 MG PO TABS: ORAL_TABLET | ORAL | 1 refills | Status: DC

## 2016-11-13 MED ORDER — EMPAGLIFLOZIN 25 MG PO TABS: 25.0000 mg | ORAL_TABLET | Freq: Every day | ORAL | 1 refills | Status: DC

## 2016-11-13 MED ORDER — GLIPIZIDE 5 MG PO TABS: 10.0000 mg | ORAL_TABLET | Freq: Two times a day (BID) | ORAL | 1 refills | Status: DC

## 2016-11-13 NOTE — Progress Notes (Signed)
Subjective:    Patient ID: Grant Torres, male    DOB: [birth year], 56 y.o.   MRN: [id number]  HPI The patient comes in today for a wellness visit.  Results for orders placed or performed in visit on 11/13/16  POCT glycosylated hemoglobin (Hb A1C)  Result Value Ref Range   Hemoglobin A1C 8.5      A review of their health history was completed.  A review of medications was also completed.  Any needed refills; all meds  Eating habits: health conscious  Falls/  MVA accidents in past few months: none  Regular exercise: not really. Occasionally walking  Specialist pt sees on regular basis: none  Preventative health issues were discussed.   Additional concerns: none  Diabetes. A1C today. 8.5 Patient claims compliance with diabetes medication. No obvious side effects. Reports no substantial low sugar spells. Most numbers are generally in good range when checked fasting. Generally does not miss a dose of medication. Watching diabetic diet closely  Blood pressure medicine and blood pressure levels reviewed today with patient. Compliant with blood pressure medicine. States does not miss a dose. No obvious side effects. Blood pressure generally good when checked elsewhere. Watching salt intake.  Patient continues to take lipid medication regularly. No obvious side effects from it. Generally does not miss a dose. Prior blood work results are reviewed with patient. Patient continues to work on fat intake in diet  trying to eat the right things overall, eating soup citrus fruit,  Flu shot already given  Exercise not a lot at this time, work is wide open crazy schedule.  Weekends and recovery      Review of Systems  Constitutional: Negative for activity change, appetite change and fever.  HENT: Negative for congestion and rhinorrhea.   Eyes: Negative for discharge.  Respiratory: Negative for cough and wheezing.   Cardiovascular: Negative for chest pain.  Gastrointestinal:  Negative for abdominal pain, blood in stool and vomiting.  Genitourinary: Negative for difficulty urinating and frequency.  Musculoskeletal: Negative for neck pain.  Skin: Negative for rash.  Allergic/Immunologic: Negative for environmental allergies and food allergies.  Neurological: Negative for weakness and headaches.  Psychiatric/Behavioral: Negative for agitation.  All other systems reviewed and are negative.      Objective:   Physical Exam  Constitutional: He appears well-developed and well-nourished.  HENT:  Head: Normocephalic and atraumatic.  Right Ear: External ear normal.  Left Ear: External ear normal.  Nose: Nose normal.  Mouth/Throat: Oropharynx is clear and moist.  Eyes: EOM are normal. Pupils are equal, round, and reactive to light.  Neck: Normal range of motion. Neck supple. No thyromegaly present.  Cardiovascular: Normal rate, regular rhythm and normal heart sounds.   No murmur heard. Pulmonary/Chest: Effort normal and breath sounds normal. No respiratory distress. He has no wheezes.  Abdominal: Soft. Bowel sounds are normal. He exhibits no distension and no mass. There is no tenderness.  Genitourinary: Prostate normal and penis normal.  Musculoskeletal: Normal range of motion. He exhibits no edema.  Lymphadenopathy:    He has no cervical adenopathy.  Neurological: He is alert. He exhibits normal muscle tone.  Skin: Skin is warm and dry. No erythema.  Psychiatric: He has a normal mood and affect. His behavior is normal. Judgment normal.  Vitals reviewed.         Assessment & Plan:  Colonoscopy discussed and strongly encour, pt to think about. Impression 1 illness exam. See above regarding colonoscopy #2 type 2 diabetes  suboptimum control in fact 4 at 8.5. Long discussion held. Patient willing to add the medicines #3 hypertension decent control #4 hyperlipidemia status uncertain and weight blood work. Patient declines formal colonoscopy referral this time.  Arteries had flu shot. Her schedule I visit. Add jardiance rationale discussed side effects benefits discussed recheck in 6

## 2016-11-14 ENCOUNTER — Encounter: Payer: Self-pay | Admitting: Family Medicine

## 2016-11-14 LAB — BASIC METABOLIC PANEL
BUN / CREAT RATIO: 25 — AB (ref 9–20)
BUN: 17 mg/dL (ref 6–24)
CO2: 25 mmol/L (ref 18–29)
Calcium: 9.6 mg/dL (ref 8.7–10.2)
Chloride: 97 mmol/L (ref 96–106)
Creatinine, Ser: 0.69 mg/dL — ABNORMAL LOW (ref 0.76–1.27)
GFR calc Af Amer: 124 mL/min/{1.73_m2} (ref >59)
GFR calc non Af Amer: 108 mL/min/{1.73_m2} (ref >59)
GLUCOSE: 230 mg/dL — AB (ref 65–99)
Potassium: 4.4 mmol/L (ref 3.5–5.2)
SODIUM: 139 mmol/L (ref 134–144)

## 2016-11-14 LAB — MICROALBUMIN / CREATININE URINE RATIO
CREATININE, UR: 74.1 mg/dL
MICROALB/CREAT RATIO: 32.7 mg/g{creat} — AB (ref 0.0–30.0)
Microalbumin, Urine: 24.2 ug/mL

## 2016-11-14 LAB — LIPID PANEL
CHOL/HDL RATIO: 3 ratio (ref 0.0–5.0)
Cholesterol, Total: 121 mg/dL (ref 100–199)
HDL: 41 mg/dL (ref >39)
LDL Calculated: 66 mg/dL (ref 0–99)
TRIGLYCERIDES: 68 mg/dL (ref 0–149)
VLDL CHOLESTEROL CAL: 14 mg/dL (ref 5–40)

## 2016-11-14 LAB — HEPATIC FUNCTION PANEL
ALT: 28 IU/L (ref 0–44)
AST: 23 IU/L (ref 0–40)
Albumin: 4.6 g/dL (ref 3.5–5.5)
Alkaline Phosphatase: 57 IU/L (ref 39–117)
BILIRUBIN, DIRECT: 0.19 mg/dL (ref 0.00–0.40)
Bilirubin Total: 0.6 mg/dL (ref 0.0–1.2)
Total Protein: 6.9 g/dL (ref 6.0–8.5)

## 2016-11-14 LAB — PSA: PROSTATE SPECIFIC AG, SERUM: 1.8 ng/mL (ref 0.0–4.0)

## 2016-11-26 ENCOUNTER — Ambulatory Visit: Payer: Managed Care, Other (non HMO) | Admitting: Family Medicine

## 2016-11-28 LAB — HM DIABETES EYE EXAM

## 2016-11-29 ENCOUNTER — Ambulatory Visit (INDEPENDENT_AMBULATORY_CARE_PROVIDER_SITE_OTHER): Payer: Managed Care, Other (non HMO) | Admitting: Family Medicine

## 2016-11-29 ENCOUNTER — Encounter: Payer: Self-pay | Admitting: Family Medicine

## 2016-11-29 VITALS — BP 126/86 | Ht 70.5 in | Wt 182.6 lb

## 2016-11-29 DIAGNOSIS — M7702 Medial epicondylitis, left elbow: Secondary | ICD-10-CM

## 2016-11-29 DIAGNOSIS — M7712 Lateral epicondylitis, left elbow: Secondary | ICD-10-CM | POA: Diagnosis not present

## 2016-11-29 NOTE — Progress Notes (Signed)
   Subjective:    Patient ID: Grant Torres, male    DOB: Feb 18, 1961, 56 y.o.   MRN: [id number]  HPI Patient arrives with c/o spot on his back for a few months., not sure of what it si. Mole or something??  Flares up at time   Patient also states he has been having left elbow pain for months. Lat elbow, months, recalls no excerbation, but does a lot of lifting. No hx elbow pain, pos hx of neuropathy, worse with constriction  Right handed,     Review of Systems No headache, no major weight loss or weight gain, no chest pain no back pain abdominal pain no change in bowel habits complete ROS otherwise negative     Objective:   Physical Exam  Alert vitals stable, NAD. Blood pressure good on repeat. HEENT normal. Lungs clear. Heart regular rate and rhythm.  back too small discrete seborrheic keratoses noted left elbow good range of motion discrete epicondyle tederness      Assessment & Plan:   impresion 1 medial/lateral epicondylitis left elbow. #2 seborrheic keratosis reassured no need for dermatology referral plan form strap. Anti-inflammatory mediine when neessary. Expect slow resolution discussed WSL

## 2016-12-03 ENCOUNTER — Telehealth: Payer: Self-pay | Admitting: Family Medicine

## 2016-12-03 NOTE — Telephone Encounter (Signed)
Referral in system for Gastroenterology  OV note discusses need for dermatology referral and some elbow pain  Please clarify

## 2016-12-03 NOTE — Telephone Encounter (Signed)
Pt seen just week or so previous for wellness and decided wanted to get on with g I,ref for colonoscopy, I cant remember if they had preference

## 2016-12-05 ENCOUNTER — Encounter (INDEPENDENT_AMBULATORY_CARE_PROVIDER_SITE_OTHER): Payer: Self-pay | Admitting: *Deleted

## 2016-12-11 ENCOUNTER — Ambulatory Visit (INDEPENDENT_AMBULATORY_CARE_PROVIDER_SITE_OTHER): Payer: Managed Care, Other (non HMO) | Admitting: Family Medicine

## 2016-12-11 ENCOUNTER — Encounter: Payer: Self-pay | Admitting: Family Medicine

## 2016-12-11 VITALS — Temp 99.2°F | Ht 70.5 in | Wt 184.0 lb

## 2016-12-11 DIAGNOSIS — J111 Influenza due to unidentified influenza virus with other respiratory manifestations: Secondary | ICD-10-CM | POA: Diagnosis not present

## 2016-12-11 DIAGNOSIS — J329 Chronic sinusitis, unspecified: Secondary | ICD-10-CM | POA: Diagnosis not present

## 2016-12-11 MED ORDER — AMOXICILLIN-POT CLAVULANATE 875-125 MG PO TABS: 1.0000 | ORAL_TABLET | Freq: Two times a day (BID) | ORAL | 0 refills | Status: AC

## 2016-12-11 MED ORDER — BENZONATATE 100 MG PO CAPS: 100.0000 mg | ORAL_CAPSULE | Freq: Three times a day (TID) | ORAL | 0 refills | Status: DC | PRN

## 2016-12-11 NOTE — Progress Notes (Signed)
   Subjective:    Patient ID: Grant Torres, male    DOB: 1961/07/04, 56 y.o.   MRN: [id number]  Sinusitis  This is a new problem. Episode onset: 3 days. Associated symptoms include congestion, coughing, headaches and a sore throat. (Fever) Treatments tried: aspirin, essential oil, otc cold meds.   fri night started snify and stopped up  Sat am bad headache , diffuse, worse with cough   Runny nose and cough  Notes fever and pain behind eyes  Zero energy   Bad cough   Sig sore throat very bad. Pressure and cong in the chest  sinux meds prn, cough med prn , and aspirin prn   Ibuprofen prn also   Essential oils, trying to help soothe symtoms     Review of Systems  HENT: Positive for congestion and sore throat.   Respiratory: Positive for cough.   Neurological: Positive for headaches.       Objective:   Physical Exam  Alert, mild malaise. Hydration good Vitals stable. frontal/ maxillary tenderness evident positive nasal congestion. pharynx normal neck supple  lungs clear/no crackles or wheezes. heart regular in rhythm       Assessment & Plan:  Impression rhinosinusitis likelyPost influenza, to late for Tamiflu discussed, post viral, discussed with patient. plan antibiotics prescribed. Questions answered. Symptomatic care discussed. warning signs discussed. WSL

## 2016-12-23 ENCOUNTER — Other Ambulatory Visit: Payer: Self-pay | Admitting: Family Medicine

## 2017-01-12 ENCOUNTER — Encounter: Payer: Self-pay | Admitting: Family Medicine

## 2017-01-12 LAB — HM DIABETES EYE EXAM

## 2017-01-17 IMAGING — DX DG CERVICAL SPINE COMPLETE 4+V
6 series · 6 of 6 positions shown · non-contrast
Comparison: None in PACs

CLINICAL DATA: O waking with right-sided neck pain with pain
radiating down the arm and numbness of the second and third fingers;
symptoms began [REDACTED]; no known injury; initial visit.

EXAM:
CERVICAL SPINE - COMPLETE 4+ VIEW

[c-spine lat]
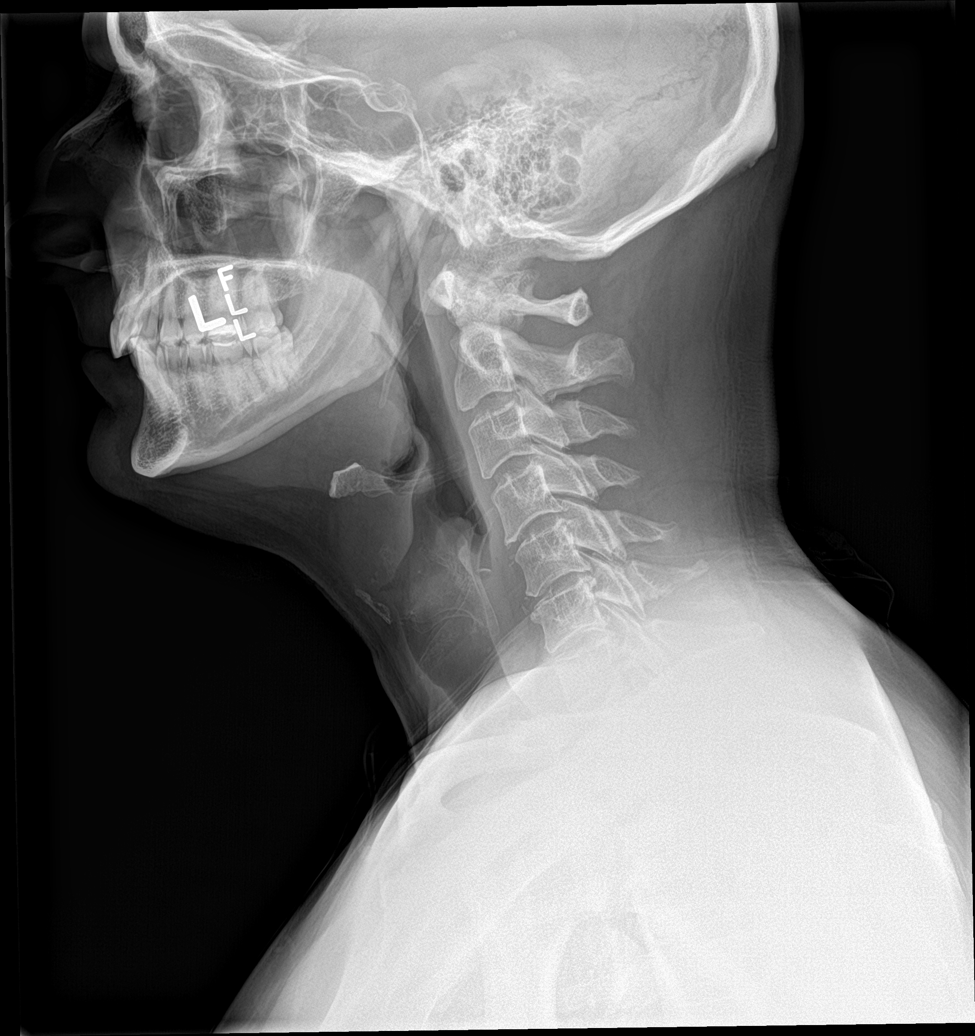

[c-spine obl (1 of 2)]
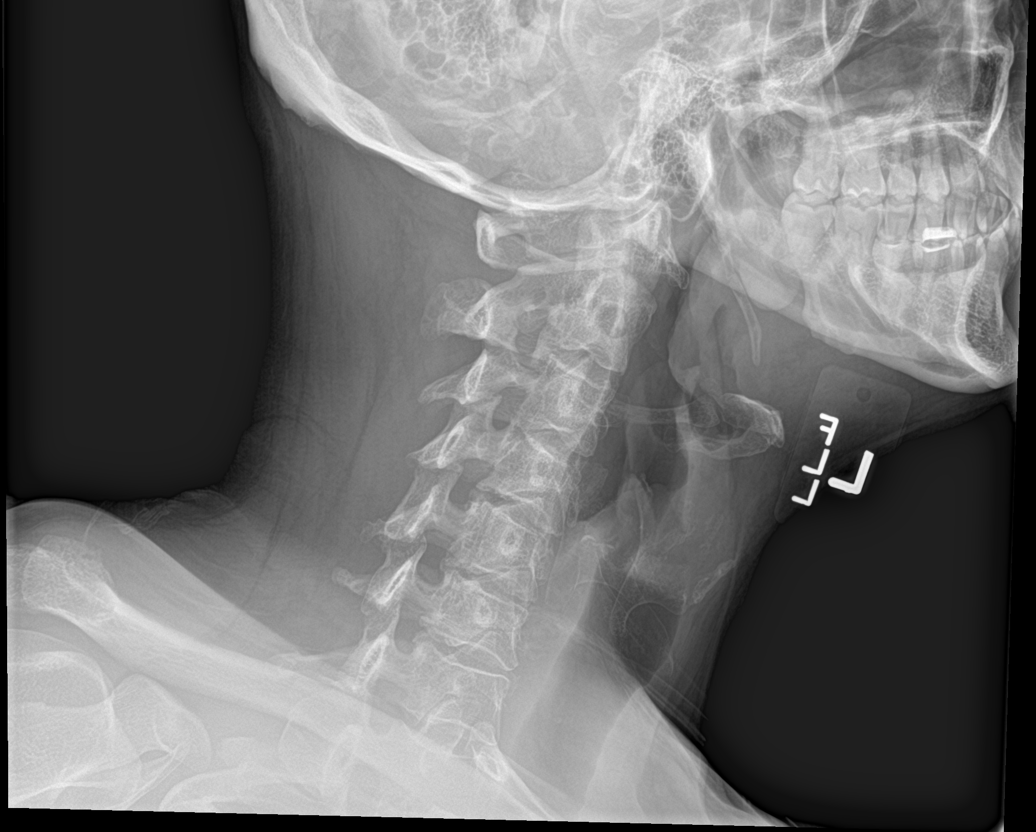

[c-spine obl (2 of 2)]
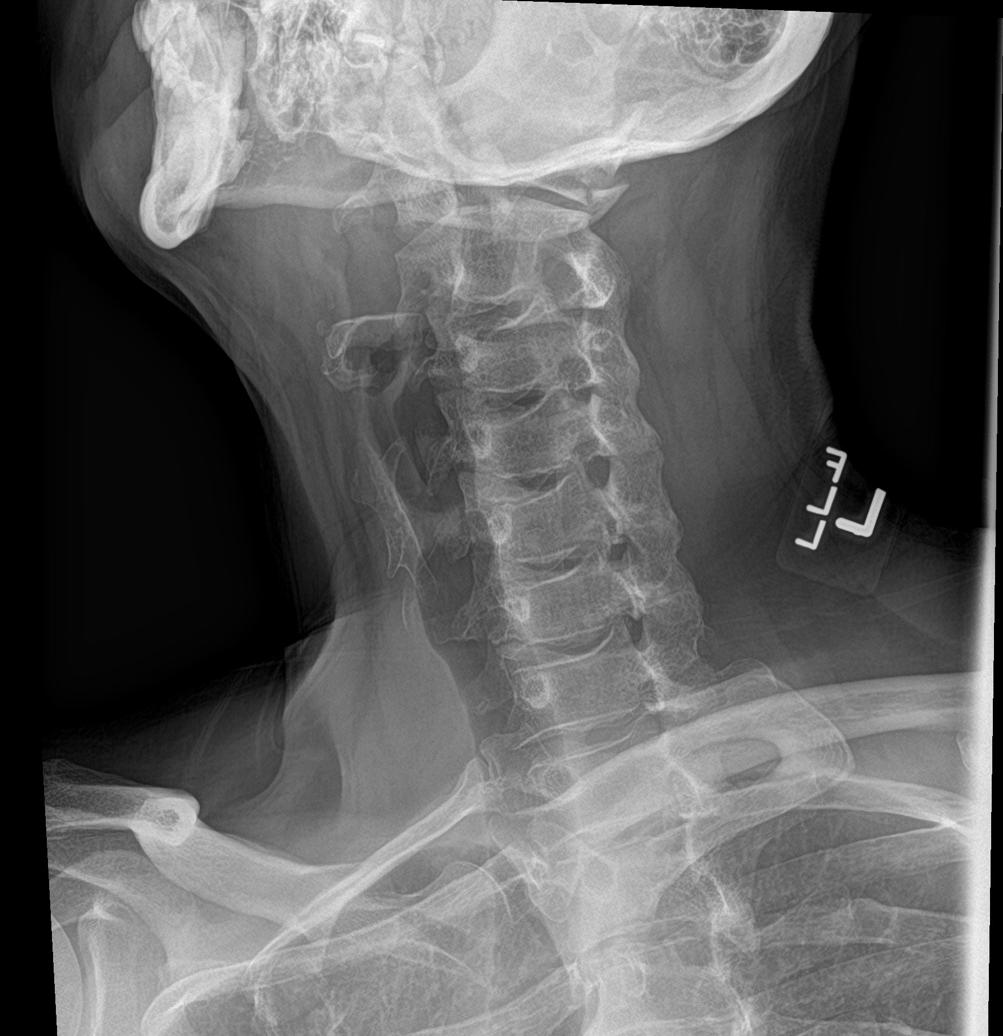

[c-spine ap]
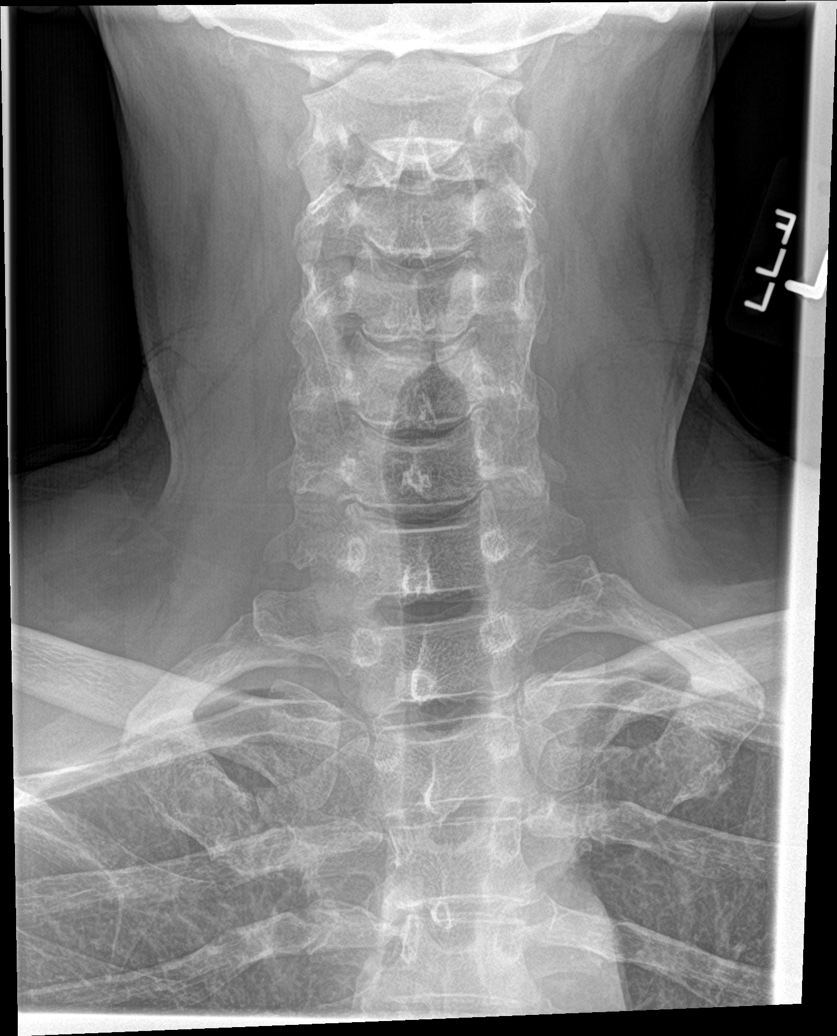

[c-spine open mouth]
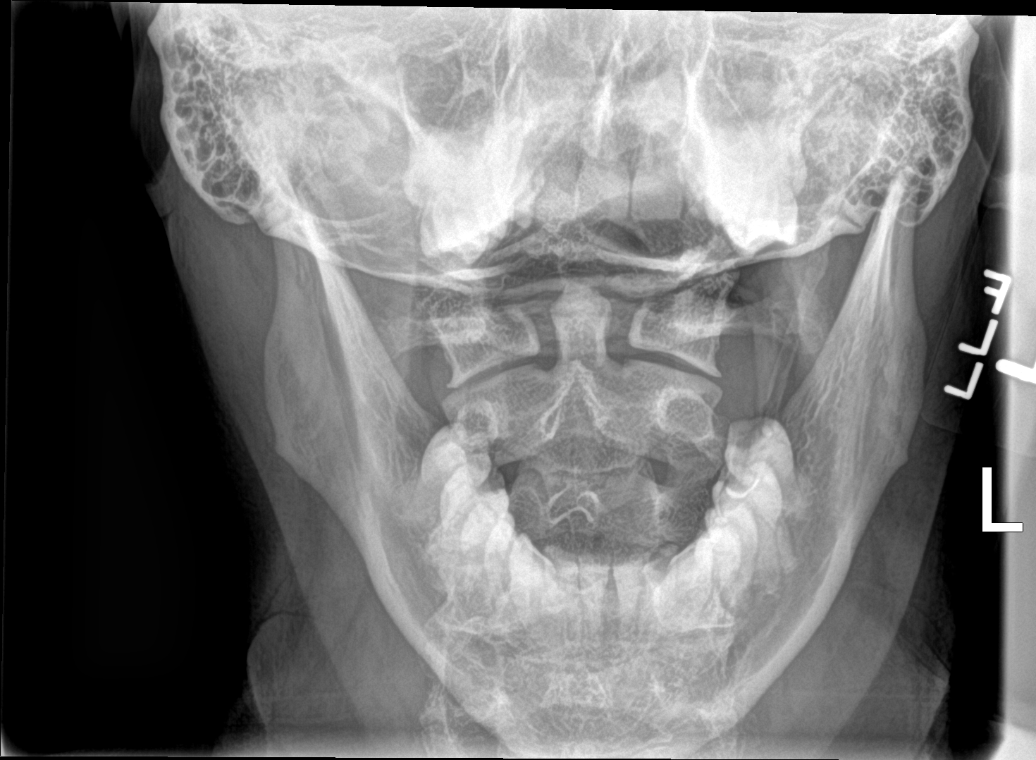

[c-spine swimmers]
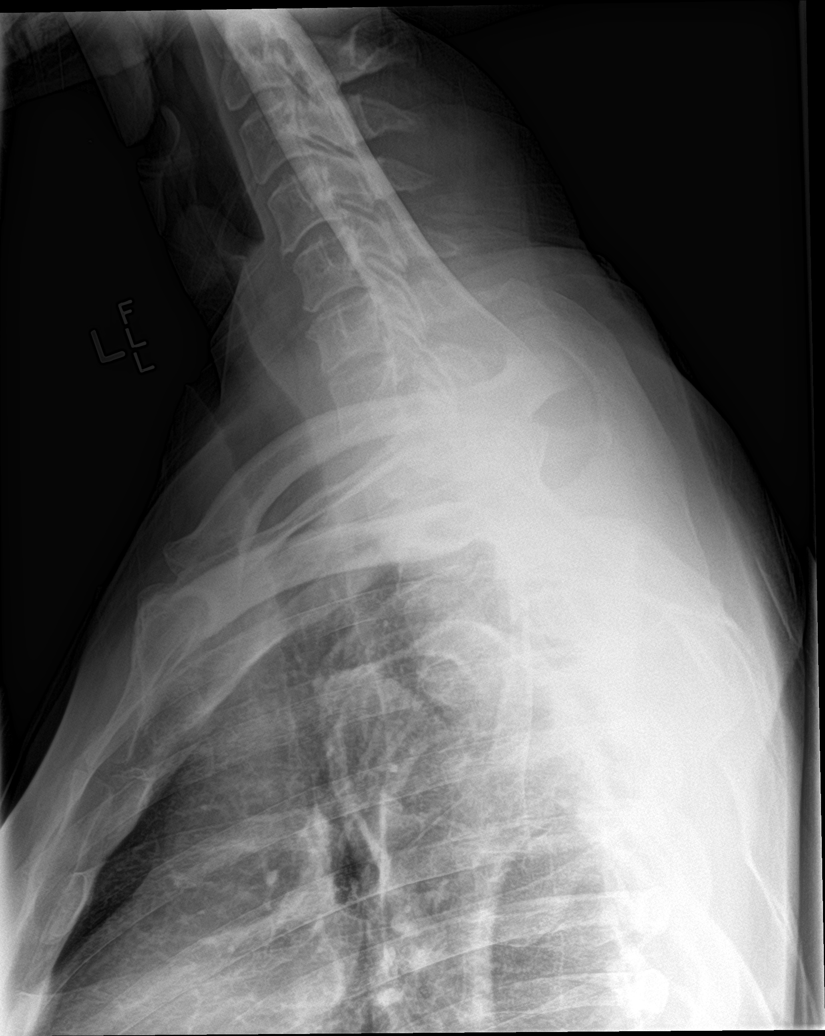

[6 of 6 positions shown; findings below may reference images not displayed]

FINDINGS: The cervical vertebral bodies are preserved in height. The disc
space heights are well maintained with exception of C5-6 where there
is very mild narrowing. There are posterior endplate osteophytes at
C5-6. There is no spondylolisthesis. There is no perched facet. The
spinous processes are intact. The oblique views reveal no high-grade
bony encroachment upon the neural foramina. The odontoid is intact.
The prevertebral soft tissue spaces are normal.
IMPRESSION: Mild loss of the normal cervical lordosis consistent with muscle
spasm. Mild degenerative disc space narrowing at C5-6 with small
posterior endplate osteophytes. There is no acute bony abnormality.
Given the patient's persistent radicular symptoms, further
evaluation with MRI may be the most useful next step.

## 2017-02-07 ENCOUNTER — Other Ambulatory Visit: Payer: Self-pay | Admitting: Family Medicine

## 2017-05-07 ENCOUNTER — Other Ambulatory Visit: Payer: Self-pay | Admitting: Family Medicine

## 2017-05-08 ENCOUNTER — Other Ambulatory Visit: Payer: Self-pay | Admitting: Family Medicine

## 2017-05-10 ENCOUNTER — Ambulatory Visit: Payer: Managed Care, Other (non HMO) | Admitting: Family Medicine

## 2017-05-12 ENCOUNTER — Other Ambulatory Visit: Payer: Self-pay | Admitting: Family Medicine

## 2017-05-20 ENCOUNTER — Ambulatory Visit: Payer: Managed Care, Other (non HMO) | Admitting: Family Medicine

## 2017-05-21 ENCOUNTER — Encounter: Payer: Self-pay | Admitting: Family Medicine

## 2017-05-21 ENCOUNTER — Ambulatory Visit (INDEPENDENT_AMBULATORY_CARE_PROVIDER_SITE_OTHER): Payer: Managed Care, Other (non HMO) | Admitting: Family Medicine

## 2017-05-21 VITALS — BP 134/86 | Ht 70.0 in | Wt 180.8 lb

## 2017-05-21 DIAGNOSIS — E785 Hyperlipidemia, unspecified: Secondary | ICD-10-CM

## 2017-05-21 DIAGNOSIS — E119 Type 2 diabetes mellitus without complications: Secondary | ICD-10-CM

## 2017-05-21 DIAGNOSIS — I10 Essential (primary) hypertension: Secondary | ICD-10-CM

## 2017-05-21 LAB — POCT GLYCOSYLATED HEMOGLOBIN (HGB A1C): Hemoglobin A1C: 6.3

## 2017-05-21 MED ORDER — ATORVASTATIN CALCIUM 40 MG PO TABS: 20.0000 mg | ORAL_TABLET | Freq: Every day | ORAL | 5 refills | Status: DC

## 2017-05-21 MED ORDER — METFORMIN HCL 500 MG PO TABS: ORAL_TABLET | ORAL | 5 refills | Status: DC

## 2017-05-21 MED ORDER — GLIPIZIDE 5 MG PO TABS: 10.0000 mg | ORAL_TABLET | Freq: Two times a day (BID) | ORAL | 5 refills | Status: DC

## 2017-05-21 MED ORDER — LEVOTHYROXINE SODIUM 50 MCG PO TABS: 50.0000 ug | ORAL_TABLET | Freq: Every day | ORAL | 5 refills | Status: DC

## 2017-05-21 MED ORDER — EMPAGLIFLOZIN 10 MG PO TABS: 10.0000 mg | ORAL_TABLET | Freq: Every day | ORAL | 5 refills | Status: DC

## 2017-05-21 MED ORDER — ENALAPRIL MALEATE 20 MG PO TABS: 20.0000 mg | ORAL_TABLET | Freq: Every day | ORAL | 5 refills | Status: DC

## 2017-05-21 NOTE — Progress Notes (Signed)
   Subjective:    Patient ID: Grant Torres, male    DOB: June 03, 1961, 56 y.o.   MRN: [id number]  Diabetes  He presents for his follow-up diabetic visit. He has type 2 diabetes mellitus. Risk factors for coronary artery disease include diabetes mellitus. Current diabetic treatment includes oral agent (monotherapy). His weight is stable. He is following a diabetic diet. He has not had a previous visit with a dietitian. He does not see a podiatrist.Eye exam is current.   Results for orders placed or performed in visit on 05/21/17  POCT glycosylated hemoglobin (Hb A1C)  Result Value Ref Range   Hemoglobin A1C 6.3   HM DIABETES EYE EXAM  Result Value Ref Range   HM Diabetic Eye Exam No Retinopathy No Retinopathy   Blood pressure medicine and blood pressure levels reviewed today with patient. Compliant with blood pressure medicine. States does not miss a dose. No obvious side effects. Blood pressure generally good when checked elsewhere. Watching salt intake.   120s  Mostly no low sugar spells  Patient continues to take lipid medication regularly. No obvious side effects from it. Generally does not miss a dose. Prior blood work results are reviewed with patient. Patient continues to work on fat intake in diet  Patient claims compliance with diabetes medication. No obvious side effects. Reports no substantial low sugar spells. Most numbers are generally in good range when checked fasting. Generally does not miss a dose of medication. Watching diabetic diet closely  Chol stre               +    Going to do fasting b w this morn     No eye difficulties   Eats better in thr summer with veggies an dw melon etc  Tries ot do the right thing by diet   Review of Systems No headache, no major weight loss or weight gain, no chest pain no back pain abdominal pain no change in bowel habits complete ROS otherwise negative     Objective:   Physical Exam Alert and oriented, vitals  reviewed and stable, NAD ENT-TM's and ext canals WNL bilat via otoscopic exam Soft palate, tonsils and post pharynx WNL via oropharyngeal exam Neck-symmetric, no masses; thyroid nonpalpable and nontender Pulmonary-no tachypnea or accessory muscle use; Clear without wheezes via auscultation Card--no abnrml murmurs, rhythm reg and rate WNL Carotid pulses symmetric, without bruits        Assessment & Plan:  Impression 1 type 2 diabetes good control discussed maintain same approach A1c excellent review hypertension also good control discussed compliance discussed maintain same #3 hyperlipidemia status uncertain discuss we'll obtain blood work further management based on results. Diet exercise discussed. Medications refilled

## 2017-05-23 LAB — HEPATIC FUNCTION PANEL
ALBUMIN: 4.7 g/dL (ref 3.5–5.5)
ALK PHOS: 58 IU/L (ref 39–117)
ALT: 23 IU/L (ref 0–44)
AST: 20 IU/L (ref 0–40)
Bilirubin Total: 0.7 mg/dL (ref 0.0–1.2)
Bilirubin, Direct: 0.2 mg/dL (ref 0.00–0.40)
Total Protein: 7.1 g/dL (ref 6.0–8.5)

## 2017-05-23 LAB — LIPID PANEL
Chol/HDL Ratio: 3 ratio (ref 0.0–5.0)
Cholesterol, Total: 109 mg/dL (ref 100–199)
HDL: 36 mg/dL — ABNORMAL LOW (ref >39)
LDL Calculated: 46 mg/dL (ref 0–99)
TRIGLYCERIDES: 135 mg/dL (ref 0–149)
VLDL CHOLESTEROL CAL: 27 mg/dL (ref 5–40)

## 2017-05-24 ENCOUNTER — Encounter: Payer: Self-pay | Admitting: Family Medicine

## 2017-06-17 ENCOUNTER — Other Ambulatory Visit: Payer: Self-pay | Admitting: Family Medicine

## 2017-06-24 ENCOUNTER — Other Ambulatory Visit: Payer: Self-pay | Admitting: Family Medicine

## 2017-08-03 ENCOUNTER — Other Ambulatory Visit: Payer: Self-pay | Admitting: Family Medicine

## 2017-08-14 ENCOUNTER — Other Ambulatory Visit: Payer: Self-pay | Admitting: *Deleted

## 2017-08-14 MED ORDER — BLOOD GLUCOSE MONITOR KIT: PACK | 5 refills | Status: DC

## 2017-08-29 ENCOUNTER — Encounter (INDEPENDENT_AMBULATORY_CARE_PROVIDER_SITE_OTHER): Payer: Self-pay | Admitting: *Deleted

## 2017-10-10 ENCOUNTER — Ambulatory Visit: Payer: Managed Care, Other (non HMO) | Admitting: Family Medicine

## 2017-10-10 ENCOUNTER — Encounter: Payer: Self-pay | Admitting: Family Medicine

## 2017-10-10 VITALS — BP 130/78 | Ht 70.0 in

## 2017-10-10 DIAGNOSIS — R0789 Other chest pain: Secondary | ICD-10-CM | POA: Diagnosis not present

## 2017-10-10 MED ORDER — DIAZEPAM 5 MG PO TABS: ORAL_TABLET | ORAL | 0 refills | Status: DC

## 2017-10-10 MED ORDER — ETODOLAC 400 MG PO TABS: ORAL_TABLET | ORAL | 0 refills | Status: DC

## 2017-10-10 MED ORDER — OXYCODONE-ACETAMINOPHEN 5-325 MG PO TABS: ORAL_TABLET | ORAL | 0 refills | Status: DC

## 2017-10-10 NOTE — Progress Notes (Signed)
   Subjective:    Patient ID: Grant Torres, male    DOB: [birth year], 56 y.o.   MRN: [id number]  HPI Patient is here today with complaints of right sided back pain. He shoveled snow on Monday and did not remember injuring.Tweeked back yesterday and is in extreme pain. Feels like his ribs are separated.Has taken diazepam,oxycodone.  mon shoveled snow felt fine no prob   tue wprked all day no sig prob  Wed leaned over and had a sig pain or twinge or twist  Felt ok rest of the day  Took ib u last night   Knife like pain in ba k, feels veru bad   Worse with position or movement or   Glu overall 100 to 130  ish    Pain definitely worse with movement.  Sharp in nature.  Primarily right lateral flank.  Strain first seem to occur when shoveling snow.  Developed worsening of alert severe at times.  Over the next few days   Review of Systems No headache, no major weight loss or weight gain, no chest pain no back pain abdominal pain no change in bowel habits complete ROS otherwise negative   Pain is worse with movement    Objective:   Physical Exam  Alert substantial distress lungs clear heart regular rate and rhythm no CVA tenderness some lateral chest wall pain to deep palpation right side abdomen benign               Assessment & Plan:  1 impression thoracic strain anti-inflammatory medicine recommended./Diazepam prescribed for spasm/narcotics for severe pain warning signs discussed local measures discussed  Greater than 50% of this 25 minute face to face visit was spent in counseling and discussion and coordination of care regarding the above diagnosis/diagnosies

## 2017-10-13 ENCOUNTER — Other Ambulatory Visit: Payer: Self-pay | Admitting: Family Medicine

## 2017-11-13 ENCOUNTER — Other Ambulatory Visit (HOSPITAL_COMMUNITY): Payer: Self-pay | Admitting: Endocrinology

## 2017-11-13 DIAGNOSIS — E041 Nontoxic single thyroid nodule: Secondary | ICD-10-CM

## 2017-11-21 ENCOUNTER — Encounter: Payer: Managed Care, Other (non HMO) | Admitting: Family Medicine

## 2017-11-25 ENCOUNTER — Encounter: Payer: Self-pay | Admitting: Family Medicine

## 2017-11-25 ENCOUNTER — Encounter (INDEPENDENT_AMBULATORY_CARE_PROVIDER_SITE_OTHER): Payer: Self-pay | Admitting: *Deleted

## 2017-11-25 ENCOUNTER — Ambulatory Visit: Payer: Managed Care, Other (non HMO) | Admitting: Family Medicine

## 2017-11-25 VITALS — BP 120/80 | Ht 70.0 in | Wt 178.0 lb

## 2017-11-25 DIAGNOSIS — Z0001 Encounter for general adult medical examination with abnormal findings: Secondary | ICD-10-CM

## 2017-11-25 DIAGNOSIS — Z125 Encounter for screening for malignant neoplasm of prostate: Secondary | ICD-10-CM | POA: Diagnosis not present

## 2017-11-25 DIAGNOSIS — I10 Essential (primary) hypertension: Secondary | ICD-10-CM

## 2017-11-25 DIAGNOSIS — Z1211 Encounter for screening for malignant neoplasm of colon: Secondary | ICD-10-CM

## 2017-11-25 DIAGNOSIS — Z13220 Encounter for screening for lipoid disorders: Secondary | ICD-10-CM

## 2017-11-25 DIAGNOSIS — Z79899 Other long term (current) drug therapy: Secondary | ICD-10-CM

## 2017-11-25 DIAGNOSIS — Z0000 Encounter for general adult medical examination without abnormal findings: Secondary | ICD-10-CM

## 2017-11-25 DIAGNOSIS — E119 Type 2 diabetes mellitus without complications: Secondary | ICD-10-CM

## 2017-11-25 LAB — POCT GLYCOSYLATED HEMOGLOBIN (HGB A1C): HEMOGLOBIN A1C: 6.6

## 2017-11-25 MED ORDER — ENALAPRIL MALEATE 20 MG PO TABS: 20.0000 mg | ORAL_TABLET | Freq: Every day | ORAL | 5 refills | Status: DC

## 2017-11-25 MED ORDER — EMPAGLIFLOZIN 25 MG PO TABS: 25.0000 mg | ORAL_TABLET | Freq: Every day | ORAL | 1 refills | Status: DC

## 2017-11-25 MED ORDER — GLIPIZIDE 5 MG PO TABS: 10.0000 mg | ORAL_TABLET | Freq: Two times a day (BID) | ORAL | 5 refills | Status: DC

## 2017-11-25 MED ORDER — LEVOTHYROXINE SODIUM 50 MCG PO TABS: 50.0000 ug | ORAL_TABLET | Freq: Every day | ORAL | 5 refills | Status: DC

## 2017-11-25 MED ORDER — ATORVASTATIN CALCIUM 40 MG PO TABS: ORAL_TABLET | ORAL | 5 refills | Status: DC

## 2017-11-25 MED ORDER — METFORMIN HCL 500 MG PO TABS: ORAL_TABLET | ORAL | 5 refills | Status: DC

## 2017-11-25 NOTE — Progress Notes (Signed)
Subjective:    Patient ID: Grant Torres, male    DOB: [birth year], 57 y.o.   MRN: [id number]  HPI The patient comes in today for a wellness visit.  Results for orders placed or performed in visit on 11/25/17  POCT glycosylated hemoglobin (Hb A1C)  Result Value Ref Range   Hemoglobin A1C 6.6      A review of their health history was completed.  A review of medications was also completed.   Any needed refills; Yes  Eating habits: Good  Falls/  MVA accidents in past few months: None  Regular exercise: Yes  Specialist pt sees on regular basis: Yes,Endocrinologist for thyroid.  Preventative health issues were discussed.   Additional concerns: Numbness in left hip.  Walking several times per week  Blood pressure medicine and blood pressure levels reviewed today with patient. Compliant with blood pressure medicine. States does not miss a dose. No obvious side effects. Blood pressure generally good when checked elsewhere. Watching salt intake.   Patient continues to take lipid medication regularly. No obvious side effects from it. Generally does not miss a dose. Prior blood work results are reviewed with patient. Patient continues to work on fat intake in diet  notres lateral hip numbness which kicked in after having to sleep on the left side after a back injury before christmas  Patient claims compliance with diabetes medication. No obvious side effects. Reports no substantial low sugar spells. Most numbers are generally in good range when checked fasting. Generally does not miss a dose of medication. Watching diabetic diet closely   Notes pain in the upper neck around the vertebrae, has hx of neurosurgery, wonders if numbness is related to cervical spine known disc damzge   Review of Systems  Constitutional: Negative for activity change, appetite change and fever.  HENT: Negative for congestion and rhinorrhea.   Eyes: Negative for discharge.  Respiratory: Negative for  cough and wheezing.   Cardiovascular: Negative for chest pain.  Gastrointestinal: Negative for abdominal pain, blood in stool and vomiting.  Genitourinary: Negative for difficulty urinating and frequency.  Musculoskeletal: Negative for neck pain.  Skin: Negative for rash.  Allergic/Immunologic: Negative for environmental allergies and food allergies.  Neurological: Negative for weakness and headaches.  Psychiatric/Behavioral: Negative for agitation.  All other systems reviewed and are negative.     Also here today to follow up on DM.He is on Metformin 500 mg Two BID. Glipizide 5 mg Two BID.Jardiance 25 mg once daily.    Results for orders placed or performed in visit on 11/25/17  POCT glycosylated hemoglobin (Hb A1C)  Result Value Ref Range   Hemoglobin A1C 6.6     Objective:   Physical Exam  Constitutional: He appears well-developed and well-nourished.  HENT:  Head: Normocephalic and atraumatic.  Right Ear: External ear normal.  Left Ear: External ear normal.  Nose: Nose normal.  Mouth/Throat: Oropharynx is clear and moist.  Eyes: Right eye exhibits no discharge. Left eye exhibits no discharge. No scleral icterus.  Neck: Normal range of motion. Neck supple. No thyromegaly present.  Cardiovascular: Normal rate, regular rhythm and normal heart sounds.  No murmur heard. Pulmonary/Chest: Effort normal and breath sounds normal. No respiratory distress. He has no wheezes.  Abdominal: Soft. Bowel sounds are normal. He exhibits no distension and no mass. There is no tenderness.  Genitourinary: Penis normal.  Musculoskeletal: Normal range of motion. He exhibits no edema.  Lymphadenopathy:    He has no cervical adenopathy.  Neurological:  He is alert. He exhibits normal muscle tone. Coordination normal.  Skin: Skin is warm and dry. No erythema.  Psychiatric: He has a normal mood and affect. His behavior is normal. Judgment normal.  Vitals reviewed.         Assessment & Plan:    Impression wellness exam.  Time to press on with [facility] neurology referral for colonoscopy.  Diet discussed.  Exercise discussed.  Type 2 diabetes good control discussed maintain same meds  Hypertension good control discussed to maintain same  Hyperlipidemia prior blood work reviewed discussed importance of compliance discussed

## 2017-11-26 LAB — BASIC METABOLIC PANEL
BUN/Creatinine Ratio: 21 — ABNORMAL HIGH (ref 9–20)
BUN: 15 mg/dL (ref 6–24)
CALCIUM: 9.8 mg/dL (ref 8.7–10.2)
CHLORIDE: 100 mmol/L (ref 96–106)
CO2: 24 mmol/L (ref 20–29)
Creatinine, Ser: 0.7 mg/dL — ABNORMAL LOW (ref 0.76–1.27)
GFR calc Af Amer: 122 mL/min/{1.73_m2} (ref >59)
GFR calc non Af Amer: 105 mL/min/{1.73_m2} (ref >59)
GLUCOSE: 149 mg/dL — AB (ref 65–99)
POTASSIUM: 4.9 mmol/L (ref 3.5–5.2)
Sodium: 139 mmol/L (ref 134–144)

## 2017-11-26 LAB — MICROALBUMIN / CREATININE URINE RATIO
CREATININE, UR: 71.2 mg/dL
MICROALB/CREAT RATIO: 5.5 mg/g{creat} (ref 0.0–30.0)
MICROALBUM., U, RANDOM: 3.9 ug/mL

## 2017-11-26 LAB — LIPID PANEL
CHOL/HDL RATIO: 2.7 ratio (ref 0.0–5.0)
Cholesterol, Total: 114 mg/dL (ref 100–199)
HDL: 42 mg/dL (ref >39)
LDL CALC: 54 mg/dL (ref 0–99)
Triglycerides: 88 mg/dL (ref 0–149)
VLDL Cholesterol Cal: 18 mg/dL (ref 5–40)

## 2017-11-26 LAB — HEPATIC FUNCTION PANEL
ALBUMIN: 4.7 g/dL (ref 3.5–5.5)
ALK PHOS: 59 IU/L (ref 39–117)
ALT: 20 IU/L (ref 0–44)
AST: 17 IU/L (ref 0–40)
BILIRUBIN TOTAL: 0.6 mg/dL (ref 0.0–1.2)
BILIRUBIN, DIRECT: 0.18 mg/dL (ref 0.00–0.40)
TOTAL PROTEIN: 7.3 g/dL (ref 6.0–8.5)

## 2017-11-26 LAB — PSA: Prostate Specific Ag, Serum: 2 ng/mL (ref 0.0–4.0)

## 2017-11-28 ENCOUNTER — Encounter: Payer: Self-pay | Admitting: Family Medicine

## 2017-12-16 ENCOUNTER — Ambulatory Visit (HOSPITAL_COMMUNITY)
Admission: RE | Admit: 2017-12-16 | Discharge: 2017-12-16 | Disposition: A | Discharge status: Home / Self Care | Payer: Managed Care, Other (non HMO) | Source: Ambulatory Visit | Attending: Endocrinology | Admitting: Endocrinology

## 2017-12-16 DIAGNOSIS — E041 Nontoxic single thyroid nodule: Secondary | ICD-10-CM | POA: Diagnosis present

## 2017-12-23 ENCOUNTER — Other Ambulatory Visit (INDEPENDENT_AMBULATORY_CARE_PROVIDER_SITE_OTHER): Payer: Self-pay | Admitting: Internal Medicine

## 2017-12-23 DIAGNOSIS — Z1211 Encounter for screening for malignant neoplasm of colon: Secondary | ICD-10-CM | POA: Insufficient documentation

## 2018-01-10 LAB — HM DIABETES EYE EXAM

## 2018-01-12 ENCOUNTER — Other Ambulatory Visit: Payer: Self-pay | Admitting: Family Medicine

## 2018-02-25 ENCOUNTER — Encounter (INDEPENDENT_AMBULATORY_CARE_PROVIDER_SITE_OTHER): Payer: Self-pay | Admitting: *Deleted

## 2018-02-25 ENCOUNTER — Telehealth (INDEPENDENT_AMBULATORY_CARE_PROVIDER_SITE_OTHER): Payer: Self-pay | Admitting: *Deleted

## 2018-02-25 MED ORDER — PEG 3350-KCL-NA BICARB-NACL 420 G PO SOLR: 4000.0000 mL | Freq: Once | ORAL | 0 refills | Status: AC

## 2018-02-25 NOTE — Telephone Encounter (Signed)
Patient needs trilyte 

## 2018-03-11 ENCOUNTER — Telehealth (INDEPENDENT_AMBULATORY_CARE_PROVIDER_SITE_OTHER): Payer: Self-pay | Admitting: *Deleted

## 2018-03-11 NOTE — Telephone Encounter (Signed)
Referring MD/PCP: steveluking   Procedure: tcs  Reason/Indication:  screening  Has patient had this procedure before?  no  If so, when, by whom and where?    Is there a family history of colon cancer?  no  Who?  What age when diagnosed?    Is patient diabetic?   yes      Does patient have prosthetic heart valve or mechanical valve?  no  Do you have a pacemaker?  no  Has patient ever had endocarditis? no  Has patient had joint replacement within last 12 months?  no  Is patient constipated or do they take laxatives? no  Does patient have a history of alcohol/drug use?  no  Is patient on blood thinner such as Coumadin, Plavix and/or Aspirin? no  Medications: metformin 1000 mg bid, enalapril 20 mg daily, levothyroxine 50 mg daily, glipizide 10 mg bid, atorvastatin 20 mg daily, jardiance 25 mg daily  Allergies: nkda  Medication Adjustment per Dr Lindi Adie, NP: hold diabetic meds evening before and morning of  Procedure date & time: 04/10/18 at 730

## 2018-03-12 NOTE — Telephone Encounter (Signed)
agree

## 2018-03-19 ENCOUNTER — Encounter (INDEPENDENT_AMBULATORY_CARE_PROVIDER_SITE_OTHER): Payer: Self-pay | Admitting: *Deleted

## 2018-04-10 ENCOUNTER — Encounter (HOSPITAL_COMMUNITY): Payer: Self-pay | Admitting: *Deleted

## 2018-04-10 ENCOUNTER — Other Ambulatory Visit: Payer: Self-pay

## 2018-04-10 ENCOUNTER — Encounter (HOSPITAL_COMMUNITY)
Admission: RE | Disposition: A | Discharge status: Home Health | Payer: Self-pay | Source: Ambulatory Visit | Attending: Internal Medicine

## 2018-04-10 ENCOUNTER — Ambulatory Visit (HOSPITAL_COMMUNITY)
Admission: RE | Admit: 2018-04-10 | Discharge: 2018-04-10 | Disposition: A | Discharge status: Home Health | Payer: Managed Care, Other (non HMO) | Source: Ambulatory Visit | Attending: Internal Medicine | Admitting: Internal Medicine

## 2018-04-10 DIAGNOSIS — Z1211 Encounter for screening for malignant neoplasm of colon: Secondary | ICD-10-CM | POA: Insufficient documentation

## 2018-04-10 DIAGNOSIS — Z79899 Other long term (current) drug therapy: Secondary | ICD-10-CM | POA: Insufficient documentation

## 2018-04-10 DIAGNOSIS — Z8249 Family history of ischemic heart disease and other diseases of the circulatory system: Secondary | ICD-10-CM | POA: Insufficient documentation

## 2018-04-10 DIAGNOSIS — D122 Benign neoplasm of ascending colon: Secondary | ICD-10-CM | POA: Insufficient documentation

## 2018-04-10 DIAGNOSIS — Z79890 Hormone replacement therapy (postmenopausal): Secondary | ICD-10-CM | POA: Diagnosis not present

## 2018-04-10 DIAGNOSIS — E119 Type 2 diabetes mellitus without complications: Secondary | ICD-10-CM | POA: Diagnosis not present

## 2018-04-10 DIAGNOSIS — E785 Hyperlipidemia, unspecified: Secondary | ICD-10-CM | POA: Diagnosis not present

## 2018-04-10 DIAGNOSIS — Z800 Family history of malignant neoplasm of digestive organs: Secondary | ICD-10-CM | POA: Insufficient documentation

## 2018-04-10 DIAGNOSIS — I10 Essential (primary) hypertension: Secondary | ICD-10-CM | POA: Diagnosis not present

## 2018-04-10 DIAGNOSIS — K644 Residual hemorrhoidal skin tags: Secondary | ICD-10-CM | POA: Insufficient documentation

## 2018-04-10 DIAGNOSIS — Z7984 Long term (current) use of oral hypoglycemic drugs: Secondary | ICD-10-CM | POA: Diagnosis not present

## 2018-04-10 HISTORY — PX: COLONOSCOPY: SHX5424

## 2018-04-10 HISTORY — PX: POLYPECTOMY: SHX5525

## 2018-04-10 LAB — GLUCOSE, CAPILLARY: Glucose-Capillary: 119 mg/dL — ABNORMAL HIGH (ref 65–99)

## 2018-04-10 SURGERY — COLONOSCOPY
Anesthesia: Moderate Sedation

## 2018-04-10 MED ORDER — STERILE WATER FOR IRRIGATION IR SOLN
Status: DC | PRN
  Administered 2018-04-10: 08:00:00

## 2018-04-10 MED ORDER — SODIUM CHLORIDE 0.9 % IV SOLN
INTRAVENOUS | Status: DC
  Administered 2018-04-10: 08:00:00 via INTRAVENOUS

## 2018-04-10 MED ORDER — MIDAZOLAM HCL 5 MG/5ML IJ SOLN
INTRAMUSCULAR | Status: DC | PRN
  Administered 2018-04-10: 1 mg via INTRAVENOUS
  Administered 2018-04-10 (×2): 2 mg via INTRAVENOUS

## 2018-04-10 MED ORDER — MEPERIDINE HCL 50 MG/ML IJ SOLN
INTRAMUSCULAR | Status: AC
  Filled 2018-04-10: qty 1

## 2018-04-10 MED ORDER — MEPERIDINE HCL 50 MG/ML IJ SOLN
INTRAMUSCULAR | Status: DC | PRN
  Administered 2018-04-10 (×2): 25 mg via INTRAVENOUS

## 2018-04-10 MED ORDER — MIDAZOLAM HCL 5 MG/5ML IJ SOLN
INTRAMUSCULAR | Status: AC
  Filled 2018-04-10: qty 10

## 2018-04-10 NOTE — Discharge Instructions (Signed)
No aspirin or NSAIDs for 24 hours. Resume usual medications and diet as before. No driving for 24 hours. Physician will call with biopsy results.   Colonoscopy, Adult, Care After This sheet gives you information about how to care for yourself after your procedure. Your health care provider may also give you more specific instructions. If you have problems or questions, contact your health care provider. What can I expect after the procedure? After the procedure, it is common to have:  A small amount of blood in your stool for 24 hours after the procedure.  Some gas.  Mild abdominal cramping or bloating.  Follow these instructions at home: General instructions   For the first 24 hours after the procedure: ? Do not drive or use machinery. ? Do not sign important documents. ? Do not drink alcohol. ? Do your regular daily activities at a slower pace than normal. ? Eat soft, easy-to-digest foods. ? Rest often.  Take over-the-counter or prescription medicines only as told by your health care provider.  It is up to you to get the results of your procedure. Ask your health care provider, or the department performing the procedure, when your results will be ready. Relieving cramping and bloating  Try walking around when you have cramps or feel bloated.  Apply heat to your abdomen as told by your health care provider. Use a heat source that your health care provider recommends, such as a moist heat pack or a heating pad. ? Place a towel between your skin and the heat source. ? Leave the heat on for 20-30 minutes. ? Remove the heat if your skin turns bright red. This is especially important if you are unable to feel pain, heat, or cold. You may have a greater risk of getting burned. Eating and drinking  Drink enough fluid to keep your urine clear or pale yellow.  Resume your normal diet as instructed by your health care provider. Avoid heavy or fried foods that are hard to  digest.  Avoid drinking alcohol for as long as instructed by your health care provider. Contact a health care provider if:  You have blood in your stool 2-3 days after the procedure. Get help right away if:  You have more than a small spotting of blood in your stool.  You pass large blood clots in your stool.  Your abdomen is swollen.  You have nausea or vomiting.  You have a fever.  You have increasing abdominal pain that is not relieved with medicine. This information is not intended to replace advice given to you by your health care provider. Make sure you discuss any questions you have with your health care provider.

## 2018-04-10 NOTE — Op Note (Signed)
[facility] Patient Name: Grant Torres Procedure Date: 04/10/2018 7:57 AM MRN: [id number] Date of Birth: 06-08-1961 Attending MD: Hildred Laser , MD CSN: [id number] Age: 57 Admit Type: Outpatient Procedure:                Colonoscopy Indications:              Screening for colorectal malignant neoplasm Providers:                Hildred Laser, MD, Otis Peak B. Sharon Seller, RN, Aram Candela Referring MD:             Grace Bushy. Wolfgang Phoenix, MD Medicines:                Meperidine 50 mg IV, Midazolam 5 mg IV Complications:            No immediate complications. Estimated Blood Loss:     Estimated blood loss was minimal. Procedure:                Pre-Anesthesia Assessment:                           - Prior to the procedure, a History and Physical                            was performed, and patient medications and                            allergies were reviewed. The patient's tolerance of                            previous anesthesia was also reviewed. The risks                            and benefits of the procedure and the sedation                            options and risks were discussed with the patient.                            All questions were answered, and informed consent                            was obtained. Prior Anticoagulants: The patient has                            taken no previous anticoagulant or antiplatelet                            agents. ASA Grade Assessment: II - A patient with                            mild systemic disease. After reviewing the risks  and benefits, the patient was deemed in                            satisfactory condition to undergo the procedure.                           After obtaining informed consent, the colonoscope                            was passed under direct vision. Throughout the                            procedure, the patient's blood pressure, pulse, and                  oxygen saturations were monitored continuously. The                            [id number] ([id number]) scope was introduced through                            the anus and advanced to the the cecum, identified                            by appendiceal orifice and ileocecal valve. The                            colonoscopy was performed without difficulty. The                            patient tolerated the procedure well. The quality                            of the bowel preparation was good. The ileocecal                            valve, appendiceal orifice, and rectum were                            photographed. Scope In: 8:32:50 AM Scope Out: 8:54:08 AM Scope Withdrawal Time: 0 hours 12 minutes 36 seconds  Total Procedure Duration: 0 hours 21 minutes 18 seconds  Findings:      The perianal and digital rectal examinations were normal.      A 5 mm polyp was found in the ascending colon. The polyp was removed       with a cold snare. Resection and retrieval were complete.      The exam was otherwise normal throughout the examined colon.      External hemorrhoids were found during retroflexion. The hemorrhoids       were small. Impression:               - One 5 mm polyp in the ascending colon, removed                            with a cold snare. Resected and retrieved.                           -  External hemorrhoids. Moderate Sedation:      Moderate (conscious) sedation was administered by the endoscopy nurse       and supervised by the endoscopist. The following parameters were       monitored: oxygen saturation, heart rate, blood pressure, CO2       capnography and response to care. Total physician intraservice time was       28 minutes. Recommendation:           - Patient has a contact number available for                            emergencies. The signs and symptoms of potential                            delayed complications were discussed with the                             patient. Return to normal activities tomorrow.                            Written discharge instructions were provided to the                            patient.                           - Resume previous diet today.                           - Continue present medications.                           - No aspirin, ibuprofen, naproxen, or other                            non-steroidal anti-inflammatory drugs for 1 day.                           - Await pathology results.                           - Repeat colonoscopy is recommended. The                            colonoscopy date will be determined after pathology                            results from today's exam become available for                            review. Procedure Code(s):        --- Professional ---                           [phone], Colonoscopy, flexible; with removal of  tumor(s), polyp(s), or other lesion(s) by snare                            technique                           G0500, Moderate sedation services provided by the                            same physician or other qualified health care                            professional performing a gastrointestinal                            endoscopic service that sedation supports,                            requiring the presence of an independent trained                            observer to assist in the monitoring of the                            patient's level of consciousness and physiological                            status; initial 15 minutes of intra-service time;                            patient age 61 years or older (additional time may                            be reported with [phone], as appropriate)                           [phone], Moderate sedation services provided by the                            same physician or other qualified health care                            professional performing the  diagnostic or                            therapeutic service that the sedation supports,                            requiring the presence of an independent trained                            observer to assist in the monitoring of the                            patient's  level of consciousness and physiological                            status; each additional 15 minutes intraservice                            time (List separately in addition to code for                            primary service) Diagnosis Code(s):        --- Professional ---                           Z12.11, Encounter for screening for malignant                            neoplasm of colon                           D12.2, Benign neoplasm of ascending colon                           K64.4, Residual hemorrhoidal skin tags CPT copyright 2017 American Medical Association. All rights reserved. The codes documented in this report are preliminary and upon coder review may  be revised to meet current compliance requirements. Hildred Laser, MD Hildred Laser, MD 04/10/2018 9:04:20 AM This report has been signed electronically. Number of Addenda: 0

## 2018-04-10 NOTE — H&P (Signed)
Grant Torres is an 57 y.o. male.   Chief Complaint: Patient sent for colonoscopy. HPI: Patient is 57 year old Caucasian male who is here for screening colonoscopy.  He denies abdominal pain change in bowel habits or rectal bleeding.  [name]. Family history is negative for CRC.  Past Medical History:  Diagnosis Date  . Diabetes mellitus without complication ([place])   . Hyperlipidemia   . Hypertension   . Microproteinuria     Past Surgical History:  Procedure Laterality Date  . CERVICAL SPINE SURGERY  11/25/2015  . VASECTOMY    . WISDOM TOOTH EXTRACTION      Family History  Problem Relation Age of Onset  . Hypertension Sister   . Colon cancer Neg Hx    Social History:  reports that he has never smoked. He has never used smokeless tobacco. He reports that he does not use drugs. His alcohol history is not on file.  Allergies: No Known Allergies  Medications Prior to Admission  Medication Sig Dispense Refill  . atorvastatin (LIPITOR) 40 MG tablet TAKE 1/2 TABLET DAILY BY MOUTH (Patient taking differently: Take 20 mg by mouth daily. TAKE 1/2 TABLET DAILY BY MOUTH) 45 tablet 5  . empagliflozin (JARDIANCE) 25 MG TABS tablet Take 25 mg by mouth daily. 90 tablet 1  . enalapril (VASOTEC) 20 MG tablet Take 1 tablet (20 mg total) by mouth daily. 30 tablet 5  . glipiZIDE (GLUCOTROL) 5 MG tablet Take 2 tablets (10 mg total) by mouth 2 (two) times daily before a meal. 360 tablet 5  . levothyroxine (SYNTHROID, LEVOTHROID) 50 MCG tablet Take 1 tablet (50 mcg total) by mouth daily before breakfast. (Patient taking differently: Take 50-100 mcg by mouth See admin instructions. Mon- Fri 50 mcg, and Sat and Sun 100 mcg) 30 tablet 5  . metFORMIN (GLUCOPHAGE) 500 MG tablet TAKE 2 TABLETS (1,000 MG TOTAL) BY MOUTH 2 (TWO) TIMES DAILY WITH A MEAL. 360 tablet 5  . blood glucose meter kit and supplies KIT Dispense based on patient and insurance preference. Use to test blood sugar once  daily. ICD 10 code E11.9 1 each 5  . Blood Glucose Monitoring Suppl (ONETOUCH VERIO) w/Device KIT TEST ONCE DAILY 1 kit 0  . ONETOUCH DELICA LANCETS FINE MISC TEST ONCE A DAY 100 each 0    Results for orders placed or performed during the hospital encounter of 04/10/18 (from the past 48 hour(s))  Glucose, capillary     Status: Abnormal   Collection Time: 04/10/18  7:56 AM  Result Value Ref Range   Glucose-Capillary 119 (H) 65 - 99 mg/dL   No results found.  ROS  Blood pressure 127/75, pulse 74, temperature 97.9 F (36.6 C), temperature source Oral, resp. rate 14, height 5' 10.5" (1.791 m), weight 186 lb (84.4 kg), SpO2 100 %. Physical Exam  Constitutional: He appears well-developed and well-nourished.  HENT:  Mouth/Throat: Oropharynx is clear and moist.  Eyes: Conjunctivae are normal. No scleral icterus.  Neck: No thyromegaly present.  Cardiovascular: Normal rate, regular rhythm and normal heart sounds.  No murmur heard. Respiratory: Effort normal and breath sounds normal.  GI: Soft. He exhibits no distension and no mass. There is no tenderness.  Musculoskeletal: He exhibits no edema.  Lymphadenopathy:    He has no cervical adenopathy.  Neurological: He is alert.  Skin: Skin is warm and dry.     Assessment/Plan Average risk screening colonoscopy.  Grant Laser, MD 04/10/2018, 8:22 AM

## 2018-04-17 ENCOUNTER — Encounter (HOSPITAL_COMMUNITY): Payer: Self-pay | Admitting: Internal Medicine

## 2018-04-23 ENCOUNTER — Other Ambulatory Visit: Payer: Self-pay | Admitting: Family Medicine

## 2018-05-15 ENCOUNTER — Encounter: Payer: Self-pay | Admitting: Family Medicine

## 2018-05-15 ENCOUNTER — Ambulatory Visit: Payer: Managed Care, Other (non HMO) | Admitting: Family Medicine

## 2018-05-15 VITALS — BP 130/82 | Ht 70.0 in | Wt 182.8 lb

## 2018-05-15 DIAGNOSIS — E785 Hyperlipidemia, unspecified: Secondary | ICD-10-CM | POA: Diagnosis not present

## 2018-05-15 DIAGNOSIS — E119 Type 2 diabetes mellitus without complications: Secondary | ICD-10-CM | POA: Diagnosis not present

## 2018-05-15 DIAGNOSIS — I10 Essential (primary) hypertension: Secondary | ICD-10-CM | POA: Diagnosis not present

## 2018-05-15 DIAGNOSIS — R809 Proteinuria, unspecified: Secondary | ICD-10-CM

## 2018-05-15 LAB — POCT GLYCOSYLATED HEMOGLOBIN (HGB A1C): HEMOGLOBIN A1C: 6.3 % — AB (ref 4.0–5.6)

## 2018-05-15 MED ORDER — GLIPIZIDE 5 MG PO TABS: 10.0000 mg | ORAL_TABLET | Freq: Two times a day (BID) | ORAL | 5 refills | Status: DC

## 2018-05-15 MED ORDER — LEVOTHYROXINE SODIUM 50 MCG PO TABS: 50.0000 ug | ORAL_TABLET | Freq: Every day | ORAL | 5 refills | Status: DC

## 2018-05-15 MED ORDER — EMPAGLIFLOZIN 25 MG PO TABS: 25.0000 mg | ORAL_TABLET | Freq: Every day | ORAL | 1 refills | Status: DC

## 2018-05-15 MED ORDER — ENALAPRIL MALEATE 20 MG PO TABS: 20.0000 mg | ORAL_TABLET | Freq: Every day | ORAL | 5 refills | Status: DC

## 2018-05-15 MED ORDER — METFORMIN HCL 500 MG PO TABS: ORAL_TABLET | ORAL | 5 refills | Status: DC

## 2018-05-15 MED ORDER — ATORVASTATIN CALCIUM 40 MG PO TABS: ORAL_TABLET | ORAL | 5 refills | Status: DC

## 2018-05-15 NOTE — Progress Notes (Signed)
   Subjective:    Patient ID: Grant Torres, male    DOB: October 23, 1961, 57 y.o.   MRN: [id number]  Diabetes  He presents for his follow-up diabetic visit. He has type 2 diabetes mellitus. Current diabetic treatment includes oral agent (dual therapy). He is compliant with treatment all of the time. His weight is stable. He is following a diabetic diet. He has not had a previous visit with a dietitian. He does not see a podiatrist.Eye exam is current.   Results for orders placed or performed in visit on 05/15/18  POCT glycosylated hemoglobin (Hb A1C)  Result Value Ref Range   Hemoglobin A1C 6.3 (A) 4.0 - 5.6 %   HbA1c POC (<> result, manual entry)  4.0 - 5.6 %   HbA1c, POC (prediabetic range)  5.7 - 6.4 %   HbA1c, POC (controlled diabetic range)  0.0 - 7.0 %  HM DIABETES EYE EXAM  Result Value Ref Range   HM Diabetic Eye Exam No Retinopathy No Retinopathy   Blood pressure medicine and blood pressure levels reviewed today with patient. Compliant with blood pressure medicine. States does not miss a dose. No obvious side effects. Blood pressure generally good when checked elsewhere. Watching salt intake.   Staying acive  Pt had thyr incr to two tabs per day on the weekends   Unfortunately Dr Jerelyn Scott has seen pt, has done ultrasoounds and thyr was not growing but the growth was moving in the right direction Dr. Janene Harvey unfortunately no longer accepts signal.  Discussed with patient.   Review of Systems No headache, no major weight loss or weight gain, no chest pain no back pain abdominal pain no change in bowel habits complete ROS otherwise negative        Objective:   Physical Exam Alert and oriented, vitals reviewed and stable, NAD ENT-TM's and ext canals WNL bilat via otoscopic exam Soft palate, tonsils and post pharynx WNL via oropharyngeal exam Neck-symmetric, no masses; thyroid nonpalpable and nontender Pulmonary-no tachypnea or accessory muscle use; Clear without wheezes via  auscultation Card--no abnrml murmurs, rhythm reg and rate WNL Carotid pulses symmetric, without bruits Impression 1 type 2 diabetes       Assessment & Plan:  Impression 1 type 2 diabetes.  Control good.  Discussed.  To maintain same meds rationale discussed  2.  History of micro-proteinuria along with elevated blood pressure.  Patient compliant with medicine in this regard.  Blood pressure generally dementia tests were  3.  Hypothyroidism.  Discussed at length.  Will work on referral to new endocrinologist were discussed with patient.  Follow-up in 6 months for wellness plus chronic  Greater than 50% of this 25 minute face to face visit was spent in counseling and discussion and coordination of care regarding the above diagnosis/diagnosies

## 2018-05-16 ENCOUNTER — Encounter: Payer: Self-pay | Admitting: Family Medicine

## 2018-05-26 ENCOUNTER — Ambulatory Visit: Payer: Managed Care, Other (non HMO) | Admitting: Family Medicine

## 2018-08-11 ENCOUNTER — Encounter: Payer: Self-pay | Admitting: Endocrinology

## 2018-08-11 ENCOUNTER — Ambulatory Visit (INDEPENDENT_AMBULATORY_CARE_PROVIDER_SITE_OTHER): Payer: Managed Care, Other (non HMO) | Admitting: Endocrinology

## 2018-08-11 DIAGNOSIS — E042 Nontoxic multinodular goiter: Secondary | ICD-10-CM | POA: Insufficient documentation

## 2018-08-11 LAB — T4, FREE: FREE T4: 1.05 ng/dL (ref 0.60–1.60)

## 2018-08-11 LAB — TSH: TSH: 2.76 u[IU]/mL (ref 0.35–4.50)

## 2018-08-11 NOTE — Progress Notes (Signed)
Subjective:    Patient ID: Grant Torres, male    DOB: 1960/12/19, 57 y.o.   MRN: [id number]  HPI Pt is referred by Dr Wolfgang Phoenix, for nodular thyroid.  Pt was noted to have a thyroid nodule in 2017.   He has no h/o XRT or surgery to the neck.  Pt says he was never dx'ed with hypothyroidism, but synthroid was rx'ed for suppression of the nodule.  He has slight weight loss, but no assoc neck pain.  Pt says synthroid was increased to 50/d, except for 100/d on Sa and Su.   Past Medical History:  Diagnosis Date  . Diabetes mellitus without complication ([place])   . Hyperlipidemia   . Hypertension   . Microproteinuria     Past Surgical History:  Procedure Laterality Date  . CERVICAL SPINE SURGERY  11/25/2015  . COLONOSCOPY N/A 04/10/2018   Procedure: COLONOSCOPY;  Surgeon: Rogene Houston, MD;  Location: [facility];  Service: Endoscopy;  Laterality: N/A;  200  . POLYPECTOMY  04/10/2018   Procedure: POLYPECTOMY;  Surgeon: Rogene Houston, MD;  Location: [facility];  Service: Endoscopy;;  colon  . VASECTOMY    . WISDOM TOOTH EXTRACTION      Social History   Socioeconomic History  . Marital status: Married    Spouse name: Not on file  . Number of children: Not on file  . Years of education: Not on file  . Highest education level: Not on file  Occupational History  . Not on file  Social Needs  . Financial resource strain: Not on file  . Food insecurity:    Worry: Not on file    Inability: Not on file  . Transportation needs:    Medical: Not on file    Non-medical: Not on file  Tobacco Use  . Smoking status: Never Smoker  . Smokeless tobacco: Never Used  Substance and Sexual Activity  . Alcohol use: Not on file    Comment: occasionally  . Drug use: Never  . Sexual activity: Not on file  Lifestyle  . Physical activity:    Days per week: Not on file    Minutes per session: Not on file  . Stress: Not on file  Relationships  . Social connections:    Talks on phone: Not on  file    Gets together: Not on file    Attends religious service: Not on file    Active member of club or organization: Not on file    Attends meetings of clubs or organizations: Not on file    Relationship status: Not on file  . Intimate partner violence:    Fear of current or ex partner: Not on file    Emotionally abused: Not on file    Physically abused: Not on file    Forced sexual activity: Not on file  Other Topics Concern  . Not on file  Social History Narrative  . Not on file    Current Outpatient Medications on File Prior to Visit  Medication Sig Dispense Refill  . atorvastatin (LIPITOR) 40 MG tablet TAKE 1/2 TABLET DAILY BY MOUTH 45 tablet 5  . blood glucose meter kit and supplies KIT Dispense based on patient and insurance preference. Use to test blood sugar once daily. ICD 10 code E11.9 1 each 5  . Blood Glucose Monitoring Suppl (ONETOUCH VERIO) w/Device KIT TEST ONCE DAILY 1 kit 0  . empagliflozin (JARDIANCE) 25 MG TABS tablet Take 25 mg by  mouth daily. 90 tablet 1  . enalapril (VASOTEC) 20 MG tablet Take 1 tablet (20 mg total) by mouth daily. 30 tablet 5  . glipiZIDE (GLUCOTROL) 5 MG tablet Take 2 tablets (10 mg total) by mouth 2 (two) times daily before a meal. 360 tablet 5  . levothyroxine (SYNTHROID, LEVOTHROID) 50 MCG tablet Take 1 tablet (50 mcg total) by mouth daily before breakfast. 30 tablet 5  . metFORMIN (GLUCOPHAGE) 500 MG tablet TAKE 2 TABLETS (1,000 MG TOTAL) BY MOUTH 2 (TWO) TIMES DAILY WITH A MEAL. 360 tablet 5  . ONETOUCH DELICA LANCETS FINE MISC FOR ICD 10 E11.9 100 each 1   No current facility-administered medications on file prior to visit.     No Known Allergies  Family History  Problem Relation Age of Onset  . Hypertension Sister   . Colon cancer Neg Hx   . Thyroid disease Neg Hx     BP 124/70 (BP Location: Left Arm, Patient Position: Sitting)   Pulse 82   Ht 5' 10"  (1.778 m)   Wt 185 lb 9.6 oz (84.2 kg)   SpO2 95%   BMI 26.63 kg/m      Review of Systems Denies fatigue, hoarseness, visual loss, chest pain, sob, cough, diarrhea, itching, flushing, easy bruising, depression, cold intolerance, headache, numbness, and rhinorrhea.  He has mild solid dysphagia.       Objective:   Physical Exam VS: see vs page GEN: no distress HEAD: head: no deformity eyes: no periorbital swelling, no proptosis external nose and ears are normal mouth: no lesion seen NECK: a healed scar is present (C-spine procedure).  2 cm right thyroid nodule is easily palpable.   CHEST WALL: no deformity LUNGS: clear to auscultation CV: reg rate and rhythm, no murmur ABD: abdomen is soft, nontender.  no hepatosplenomegaly.  not distended.  no hernia MUSCULOSKELETAL: muscle bulk and strength are grossly normal.  no obvious joint swelling.  gait is normal and steady EXTEMITIES: no deformity.  no edema PULSES: no carotid bruit NEURO:  cn 2-12 grossly intact.   readily moves all 4's.  sensation is intact to touch on all 4's SKIN:  Normal texture and temperature.  No rash or suspicious lesion is visible.   NODES:  None palpable at the neck PSYCH: alert, well-oriented.  Does not appear anxious nor depressed.  cytol (1683): BENIGN FOLLICULAR NODULE (BETHESDA CATEGORY II).  [place]: No change in the previously biopsied right thyroid nodule.  The other nodules do not meet criteria for biopsy.  I have reviewed outside records, and summarized: Pt was noted to have multinodular goiter, and referred here.  He has MRI for HNP of the C-spine, and thyroid nodules were incidentally noted.    Lab Results  Component Value Date   TSH 2.76 08/11/2018   T3TOTAL 109 01/13/2016   T4TOTAL 8.1 01/13/2016      Assessment & Plan:  Multinodular goiter, new to me.  Given this TSH on synthroid and the fact it was rx'ed in the first place, I conclude she prob had hypothyroidism Dysphagia: not thyroid-related.  Should be investigated separately if indicated  Patient  Instructions  blood tests are requested for you today.  We'll let you know about the results.  Please come back for a follow-up appointment in 1 year.

## 2018-08-11 NOTE — Patient Instructions (Signed)
blood tests are requested for you today.  We'll let you know about the results. Please come back for a follow-up appointment in 1 year.   

## 2018-08-13 ENCOUNTER — Telehealth: Payer: Self-pay

## 2018-08-13 NOTE — Telephone Encounter (Signed)
-----   Message from Renato Shin, MD sent at 08/11/2018  5:45 PM EDT ----- please call patient: Normal.  Please continue the same medication. I'll see you next time.

## 2018-08-13 NOTE — Telephone Encounter (Signed)
Called pt and informed of lab results. LVM requesting returned call.

## 2018-08-14 NOTE — Telephone Encounter (Signed)
Second attempt to reach pt to review labs results and treatment plan. LVM requesting returned call. Letter also mailed along with lab results today.

## 2018-10-17 ENCOUNTER — Other Ambulatory Visit: Payer: Self-pay | Admitting: *Deleted

## 2018-10-17 MED ORDER — GLUCOSE BLOOD VI STRP: ORAL_STRIP | 5 refills | Status: DC

## 2018-11-18 ENCOUNTER — Other Ambulatory Visit: Payer: Self-pay | Admitting: Family Medicine

## 2018-11-26 ENCOUNTER — Encounter: Payer: Self-pay | Admitting: Family Medicine

## 2018-11-26 ENCOUNTER — Ambulatory Visit: Payer: Managed Care, Other (non HMO) | Admitting: Family Medicine

## 2018-11-26 VITALS — BP 128/82 | Ht 70.0 in | Wt 187.2 lb

## 2018-11-26 DIAGNOSIS — Z0000 Encounter for general adult medical examination without abnormal findings: Secondary | ICD-10-CM | POA: Diagnosis not present

## 2018-11-26 DIAGNOSIS — I10 Essential (primary) hypertension: Secondary | ICD-10-CM

## 2018-11-26 DIAGNOSIS — R809 Proteinuria, unspecified: Secondary | ICD-10-CM

## 2018-11-26 DIAGNOSIS — Z23 Encounter for immunization: Secondary | ICD-10-CM | POA: Diagnosis not present

## 2018-11-26 DIAGNOSIS — E119 Type 2 diabetes mellitus without complications: Secondary | ICD-10-CM | POA: Diagnosis not present

## 2018-11-26 DIAGNOSIS — Z125 Encounter for screening for malignant neoplasm of prostate: Secondary | ICD-10-CM | POA: Diagnosis not present

## 2018-11-26 DIAGNOSIS — E785 Hyperlipidemia, unspecified: Secondary | ICD-10-CM

## 2018-11-26 LAB — POCT GLYCOSYLATED HEMOGLOBIN (HGB A1C): Hemoglobin A1C: 6.9 % — AB (ref 4.0–5.6)

## 2018-11-26 MED ORDER — LEVOTHYROXINE SODIUM 50 MCG PO TABS: 50.0000 ug | ORAL_TABLET | Freq: Every day | ORAL | 5 refills | Status: DC

## 2018-11-26 MED ORDER — ENALAPRIL MALEATE 20 MG PO TABS: 20.0000 mg | ORAL_TABLET | Freq: Every day | ORAL | 5 refills | Status: DC

## 2018-11-26 MED ORDER — EMPAGLIFLOZIN 25 MG PO TABS: 25.0000 mg | ORAL_TABLET | Freq: Every day | ORAL | 1 refills | Status: DC

## 2018-11-26 MED ORDER — ATORVASTATIN CALCIUM 40 MG PO TABS: ORAL_TABLET | ORAL | 1 refills | Status: DC

## 2018-11-26 MED ORDER — METFORMIN HCL 500 MG PO TABS: ORAL_TABLET | ORAL | 1 refills | Status: DC

## 2018-11-26 MED ORDER — GLIPIZIDE 5 MG PO TABS: 10.0000 mg | ORAL_TABLET | Freq: Two times a day (BID) | ORAL | 1 refills | Status: DC

## 2018-11-26 NOTE — Progress Notes (Signed)
Subjective:    Patient ID: Grant Torres, male    DOB: 12-06-1960, 58 y.o.   MRN: [id number]  HPI  The patient comes in today for a wellness visit.    A review of their health history was completed.  A review of medications was also completed.  Any needed refills; yes  Eating habits: trying to eat healthy  Falls/  MVA accidents in past few months: none  Regular exercise: trying to walk  Specialist pt sees on regular basis: endocrinologist for thyroid  Preventative health issues were discussed.   Additional concerns: diabetic check up  Results for orders placed or performed in visit on 11/26/18  POCT glycosylated hemoglobin (Hb A1C)  Result Value Ref Range   Hemoglobin A1C 6.9 (A) 4.0 - 5.6 %   HbA1c POC (<> result, manual entry)     HbA1c, POC (prediabetic range)     HbA1c, POC (controlled diabetic range)     Flu shot done in sept  Exercise fair amnt of walking, dong walking at the  lihc break,  Has incr activity   Blood pressure medicine and blood pressure levels reviewed today with patient. Compliant with blood pressure medicine. States does not miss a dose. No obvious side effects. Blood pressure generally good when checked elsewhere. Watching salt intake.   Patient continues to take lipid medication regularly. No obvious side effects from it. Generally does not miss a dose. Prior blood work results are reviewed with patient. Patient continues to work on fat intake in diet   Patient claims compliance with diabetes medication. No obvious side effects. Reports no substantial low sugar spells. Most numbers are generally in good range when checked fasting. Generally does not miss a dose of medication. Watching diabetic diet closely  Most morn nimbers erecentlyu a bit elevated   Review of Systems  Constitutional: Negative for activity change, appetite change and fever.  HENT: Negative for congestion and rhinorrhea.   Eyes: Negative for discharge.  Respiratory:  Negative for cough and wheezing.   Cardiovascular: Negative for chest pain.  Gastrointestinal: Negative for abdominal pain, blood in stool and vomiting.  Genitourinary: Negative for difficulty urinating and frequency.  Musculoskeletal: Negative for neck pain.  Skin: Negative for rash.  Allergic/Immunologic: Negative for environmental allergies and food allergies.  Neurological: Negative for weakness and headaches.  Psychiatric/Behavioral: Negative for agitation.  All other systems reviewed and are negative.      Objective:   Physical Exam Vitals signs reviewed.  Constitutional:      Appearance: He is well-developed.  HENT:     Head: Normocephalic and atraumatic.     Right Ear: External ear normal.     Left Ear: External ear normal.     Nose: Nose normal.  Eyes:     Pupils: Pupils are equal, round, and reactive to light.  Neck:     Musculoskeletal: Normal range of motion and neck supple.     Thyroid: No thyromegaly.  Cardiovascular:     Rate and Rhythm: Normal rate and regular rhythm.     Heart sounds: Normal heart sounds. No murmur.  Pulmonary:     Effort: Pulmonary effort is normal. No respiratory distress.     Breath sounds: Normal breath sounds. No wheezing.  Abdominal:     General: Bowel sounds are normal. There is no distension.     Palpations: Abdomen is soft. There is no mass.     Tenderness: There is no abdominal tenderness.  Genitourinary:  Penis: Normal.   Musculoskeletal: Normal range of motion.  Lymphadenopathy:     Cervical: No cervical adenopathy.  Skin:    General: Skin is warm and dry.     Findings: No erythema.  Neurological:     Mental Status: He is alert.     Motor: No abnormal muscle tone.  Psychiatric:        Behavior: Behavior normal.        Judgment: Judgment normal.           Assessment & Plan:  Impression wellness exam.  Diet discussed.  Exercise discussed.  Vaccines discussed.  Tdap today.  Colon  6  19, to f u in ten yrs,,  2.   Type 2 diabetes.  A1c up slightly but still good control discussed to maintain same  3.  Micro-proteinuria.  Patient on ACE inhibitor tolerating well  4.  Hyperlipidemia.  Current status uncertain.  Compliance discussed  Follow-up in 6 months diet exercise discussed

## 2018-11-27 LAB — LIPID PANEL
Chol/HDL Ratio: 2.7 ratio (ref 0.0–5.0)
Cholesterol, Total: 108 mg/dL (ref 100–199)
HDL: 40 mg/dL (ref >39)
LDL CALC: 51 mg/dL (ref 0–99)
Triglycerides: 84 mg/dL (ref 0–149)
VLDL CHOLESTEROL CAL: 17 mg/dL (ref 5–40)

## 2018-11-27 LAB — BASIC METABOLIC PANEL
BUN/Creatinine Ratio: 22 — ABNORMAL HIGH (ref 9–20)
BUN: 17 mg/dL (ref 6–24)
CALCIUM: 9.7 mg/dL (ref 8.7–10.2)
CO2: 25 mmol/L (ref 20–29)
CREATININE: 0.78 mg/dL (ref 0.76–1.27)
Chloride: 97 mmol/L (ref 96–106)
GFR calc Af Amer: 116 mL/min/{1.73_m2} (ref >59)
GFR, EST NON AFRICAN AMERICAN: 100 mL/min/{1.73_m2} (ref >59)
Glucose: 153 mg/dL — ABNORMAL HIGH (ref 65–99)
POTASSIUM: 4.8 mmol/L (ref 3.5–5.2)
Sodium: 136 mmol/L (ref 134–144)

## 2018-11-27 LAB — MICROALBUMIN / CREATININE URINE RATIO
CREATININE, UR: 69.7 mg/dL
MICROALBUM., U, RANDOM: 12.6 ug/mL
Microalb/Creat Ratio: 18 mg/g creat (ref 0–29)

## 2018-11-27 LAB — HEPATIC FUNCTION PANEL
ALBUMIN: 4.9 g/dL (ref 3.8–4.9)
ALT: 29 IU/L (ref 0–44)
AST: 24 IU/L (ref 0–40)
Alkaline Phosphatase: 57 IU/L (ref 39–117)
Bilirubin Total: 0.8 mg/dL (ref 0.0–1.2)
Bilirubin, Direct: 0.23 mg/dL (ref 0.00–0.40)
TOTAL PROTEIN: 7.1 g/dL (ref 6.0–8.5)

## 2018-11-27 LAB — PSA: Prostate Specific Ag, Serum: 2.2 ng/mL (ref 0.0–4.0)

## 2018-12-05 ENCOUNTER — Encounter: Payer: Self-pay | Admitting: Family Medicine

## 2019-03-21 LAB — HM DIABETES EYE EXAM

## 2019-04-03 ENCOUNTER — Encounter: Payer: Self-pay | Admitting: Family Medicine

## 2019-04-07 ENCOUNTER — Other Ambulatory Visit: Payer: Self-pay | Admitting: Family Medicine

## 2019-04-29 ENCOUNTER — Other Ambulatory Visit: Payer: Self-pay | Admitting: Family Medicine

## 2019-04-30 ENCOUNTER — Other Ambulatory Visit: Payer: Self-pay | Admitting: Family Medicine

## 2019-05-26 ENCOUNTER — Ambulatory Visit: Payer: Managed Care, Other (non HMO) | Admitting: Family Medicine

## 2019-05-28 ENCOUNTER — Other Ambulatory Visit: Payer: Self-pay | Admitting: Family Medicine

## 2019-06-01 ENCOUNTER — Ambulatory Visit (INDEPENDENT_AMBULATORY_CARE_PROVIDER_SITE_OTHER): Payer: Managed Care, Other (non HMO) | Admitting: Family Medicine

## 2019-06-01 ENCOUNTER — Encounter: Payer: Self-pay | Admitting: Family Medicine

## 2019-06-01 ENCOUNTER — Other Ambulatory Visit: Payer: Self-pay

## 2019-06-01 VITALS — Wt 190.0 lb

## 2019-06-01 DIAGNOSIS — Z79899 Other long term (current) drug therapy: Secondary | ICD-10-CM

## 2019-06-01 DIAGNOSIS — E785 Hyperlipidemia, unspecified: Secondary | ICD-10-CM

## 2019-06-01 DIAGNOSIS — E119 Type 2 diabetes mellitus without complications: Secondary | ICD-10-CM | POA: Diagnosis not present

## 2019-06-01 MED ORDER — METFORMIN HCL 500 MG PO TABS: ORAL_TABLET | ORAL | 1 refills | Status: DC

## 2019-06-01 MED ORDER — JARDIANCE 25 MG PO TABS: 25.0000 mg | ORAL_TABLET | Freq: Every day | ORAL | 1 refills | Status: DC

## 2019-06-01 MED ORDER — ENALAPRIL MALEATE 20 MG PO TABS: 20.0000 mg | ORAL_TABLET | Freq: Every day | ORAL | 1 refills | Status: DC

## 2019-06-01 MED ORDER — GLIPIZIDE 5 MG PO TABS: 10.0000 mg | ORAL_TABLET | Freq: Two times a day (BID) | ORAL | 1 refills | Status: DC

## 2019-06-01 MED ORDER — LEVOTHYROXINE SODIUM 50 MCG PO TABS: ORAL_TABLET | ORAL | 1 refills | Status: DC

## 2019-06-01 MED ORDER — ATORVASTATIN CALCIUM 40 MG PO TABS: ORAL_TABLET | ORAL | 1 refills | Status: DC

## 2019-06-01 NOTE — Progress Notes (Signed)
   Subjective:    Patient ID: Grant Torres, male    DOB: 10/04/1961, 58 y.o.   MRN: [id number] Audio only Diabetes He presents for his follow-up diabetic visit. He has type 2 diabetes mellitus. There are no hypoglycemic associated symptoms. There are no diabetic associated symptoms. There are no hypoglycemic complications. There are no diabetic complications. Risk factors for coronary artery disease include hypertension. He does not see a podiatrist.Eye exam is current.  Hypertension This is a chronic problem. There are no compliance problems.    Virtual Visit via Video Note  I connected with [name] on 06/01/19 at  8:30 AM EDT by a video enabled telemedicine application and verified that I am speaking with the correct person using two identifiers.  Location: Patient: home Provider: office   I discussed the limitations of evaluation and management by telemedicine and the availability of in person appointments. The patient expressed understanding and agreed to proceed.  History of Present Illness:    Observations/Objective:   Assessment and Plan:   Follow Up Instructions:    I discussed the assessment and treatment plan with the patient. The patient was provided an opportunity to ask questions and all were answered. The patient agreed with the plan and demonstrated an understanding of the instructions.   The patient was advised to call back or seek an in-person evaluation if the symptoms worsen or if the condition fails to improve as anticipated.  I provided 20 minutes of non-face-to-face time during this encounter.   Vicente Males, LPN  Patient claims compliance with diabetes medication. No obvious side effects. Reports no substantial low sugar spells. Most numbers are generally in good range when checked fasting. Generally does not miss a dose of medication. Watching diabetic diet closely  Patient continues to take lipid medication regularly. No obvious side effects  from it. Generally does not miss a dose. Prior blood work results are reviewed with patient. Patient continues to work on fat intake in diet   Review of Systems No headache, no major weight loss or weight gain, no chest pain no back pain abdominal pain no change in bowel habits complete ROS otherwise negative     Objective:   Physical Exam   Virtual     Assessment & Plan:  Impression 1 type 2 diabetes.  Good control discussed compliance discussed  2.  Hyperlipidemia.  Status uncertain will check appropriate blood work.  3.  Proteinuria  Appropriate blood work meds refilled diet exercise discussed follow-up 6 months for wellness and chronic

## 2019-06-13 LAB — LIPID PANEL
Chol/HDL Ratio: 3.4 ratio (ref 0.0–5.0)
Cholesterol, Total: 116 mg/dL (ref 100–199)
HDL: 34 mg/dL — ABNORMAL LOW (ref >39)
LDL Calculated: 51 mg/dL (ref 0–99)
Triglycerides: 156 mg/dL — ABNORMAL HIGH (ref 0–149)
VLDL Cholesterol Cal: 31 mg/dL (ref 5–40)

## 2019-06-13 LAB — HEPATIC FUNCTION PANEL
ALT: 22 IU/L (ref 0–44)
AST: 20 IU/L (ref 0–40)
Albumin: 4.7 g/dL (ref 3.8–4.9)
Alkaline Phosphatase: 58 IU/L (ref 39–117)
Bilirubin Total: 0.5 mg/dL (ref 0.0–1.2)
Bilirubin, Direct: 0.14 mg/dL (ref 0.00–0.40)
Total Protein: 7 g/dL (ref 6.0–8.5)

## 2019-06-13 LAB — HEMOGLOBIN A1C
Est. average glucose Bld gHb Est-mCnc: 174 mg/dL
Hgb A1c MFr Bld: 7.7 % — ABNORMAL HIGH (ref 4.8–5.6)

## 2019-06-14 ENCOUNTER — Encounter: Payer: Self-pay | Admitting: Family Medicine

## 2019-07-26 ENCOUNTER — Other Ambulatory Visit: Payer: Self-pay | Admitting: Family Medicine

## 2019-08-10 ENCOUNTER — Ambulatory Visit: Payer: Managed Care, Other (non HMO) | Admitting: Endocrinology

## 2019-08-17 ENCOUNTER — Other Ambulatory Visit: Payer: Self-pay

## 2019-08-18 ENCOUNTER — Ambulatory Visit (INDEPENDENT_AMBULATORY_CARE_PROVIDER_SITE_OTHER): Payer: Managed Care, Other (non HMO) | Admitting: Endocrinology

## 2019-08-18 ENCOUNTER — Other Ambulatory Visit: Payer: Self-pay

## 2019-08-18 ENCOUNTER — Encounter: Payer: Self-pay | Admitting: Endocrinology

## 2019-08-18 DIAGNOSIS — E038 Other specified hypothyroidism: Secondary | ICD-10-CM | POA: Diagnosis not present

## 2019-08-18 DIAGNOSIS — E039 Hypothyroidism, unspecified: Secondary | ICD-10-CM | POA: Insufficient documentation

## 2019-08-18 MED ORDER — LEVOTHYROXINE SODIUM 75 MCG PO TABS: 75.0000 ug | ORAL_TABLET | Freq: Every day | ORAL | 3 refills | Status: DC

## 2019-08-18 NOTE — Patient Instructions (Addendum)
Your blood pressure is high today.  Please see your primary care provider soon, to have it rechecked.   I have sent a prescription to your pharmacy, to make the thyroid pill 75 mcg, every day.  blood tests are requested for you today.  Please do in 1 month.  You do not need to be fasting.  We'll let you know about the results.  Please come back for a follow-up appointment in 1 year.

## 2019-08-18 NOTE — Progress Notes (Signed)
Subjective:    Patient ID: Grant Torres, male    DOB: 09-22-1961, 58 y.o.   MRN: [id number]  HPI Pt returns for f/u of MNG (dx'ed 2017; he has been on synthroid, also since 2017; bx then showed BENIGN FOLLICULAR NODULE ([place]);  [place] in 2019 showed no change).  pt states he feels well in general.  He does not notice the goiter.   Past Medical History:  Diagnosis Date  . Diabetes mellitus without complication ([place])   . Hyperlipidemia   . Hypertension   . Microproteinuria     Past Surgical History:  Procedure Laterality Date  . CERVICAL SPINE SURGERY  11/25/2015  . COLONOSCOPY N/A 04/10/2018   Procedure: COLONOSCOPY;  Surgeon: Rogene Houston, MD;  [facility];  Service: Endoscopy;  Laterality: N/A;  200  . POLYPECTOMY  04/10/2018   Procedure: POLYPECTOMY;  Surgeon: Rogene Houston, MD;  [facility];  Service: Endoscopy;;  colon  . VASECTOMY    . WISDOM TOOTH EXTRACTION      Social History   Socioeconomic History  . Marital status: Married    Spouse name: Not on file  . Number of children: Not on file  . Years of education: Not on file  . Highest education level: Not on file  Occupational History  . Not on file  Social Needs  . Financial resource strain: Not on file  . Food insecurity    Worry: Not on file    Inability: Not on file  . Transportation needs    Medical: Not on file    Non-medical: Not on file  Tobacco Use  . Smoking status: Never Smoker  . Smokeless tobacco: Never Used  Substance and Sexual Activity  . Alcohol use: Not on file    Comment: occasionally  . Drug use: Never  . Sexual activity: Not on file  Lifestyle  . Physical activity    Days per week: Not on file    Minutes per session: Not on file  . Stress: Not on file  Relationships  . Social Herbalist on phone: Not on file    Gets together: Not on file    Attends religious service: Not on file    Active member of club or organization: Not on  file    Attends meetings of clubs or organizations: Not on file    Relationship status: Not on file  . Intimate partner violence    Fear of current or ex partner: Not on file    Emotionally abused: Not on file    Physically abused: Not on file    Forced sexual activity: Not on file  Other Topics Concern  . Not on file  Social History Narrative  . Not on file    Current Outpatient Medications on File Prior to Visit  Medication Sig Dispense Refill  . aspirin EC 81 MG tablet Take 81 mg by mouth daily.    Marland Kitchen atorvastatin (LIPITOR) 40 MG tablet TAKE 1/2 TABLET DAILY BY MOUTH 45 tablet 1  . blood glucose meter kit and supplies KIT Dispense based on patient and insurance preference. Use to test blood sugar once daily. ICD 10 code E11.9 1 each 5  . Blood Glucose Monitoring Suppl (ONETOUCH VERIO) w/Device KIT TEST ONCE DAILY 1 kit 0  . empagliflozin (JARDIANCE) 25 MG TABS tablet Take 25 mg by mouth daily. 90 tablet 1  . enalapril (VASOTEC) 20 MG tablet Take 1  tablet (20 mg total) by mouth daily. 90 tablet 1  . glipiZIDE (GLUCOTROL) 5 MG tablet Take 2 tablets (10 mg total) by mouth 2 (two) times daily before a meal. 360 tablet 1  . glucose blood test strip Test blood sugar once daily 100 each 5  . metFORMIN (GLUCOPHAGE) 500 MG tablet TAKE 2 TABLETS (1,000 MG TOTAL) BY MOUTH 2 (TWO) TIMES DAILY WITH A MEAL. 360 tablet 1  . OneTouch Delica Lancets 46I MISC USE TO TEST BLOOD SUGAR ONCE A DAY 100 each 0   No current facility-administered medications on file prior to visit.     No Known Allergies  Family History  Problem Relation Age of Onset  . Hypertension Sister   . Colon cancer Neg Hx   . Thyroid disease Neg Hx     BP 140/78 (BP Location: Right Arm, Patient Position: Sitting, Cuff Size: Normal)   Pulse 89   Ht 5' 10"  (1.778 m)   Wt 186 lb (84.4 kg)   SpO2 98%   BMI 26.69 kg/m    Review of Systems Denies neck pain    Objective:   Physical Exam VITAL SIGNS:  See vs page  GENERAL: no distress NECK: 3 cm right thyroid nodule is again noted.         Assessment & Plan:  MNG: we discussed: he declines f/u US this year.   Hypothyroidism: due for recheck.   HTN: is noted today  Patient Instructions  Your blood pressure is high today.  Please see your primary care provider soon, to have it rechecked.   I have sent a prescription to your pharmacy, to make the thyroid pill 75 mcg, every day.  blood tests are requested for you today.  Please do in 1 month.  You do not need to be fasting.  We'll let you know about the results.  Please come back for a follow-up appointment in 1 year.

## 2019-09-19 LAB — TSH: TSH: 2.71 u[IU]/mL (ref 0.450–4.500)

## 2019-09-19 LAB — T4, FREE: Free T4: 1.65 ng/dL (ref 0.82–1.77)

## 2019-09-21 ENCOUNTER — Telehealth: Payer: Self-pay

## 2019-09-21 NOTE — Telephone Encounter (Signed)
-----   Message from Renato Shin, MD sent at 09/19/2019 10:23 AM EST ----- please contact patient: Normal--good. Please continue the same medication. I'll see you next time.

## 2019-09-21 NOTE — Telephone Encounter (Signed)
Labs reviewed by Dr. Loanne Drilling. Given results are WNL, letter has been mailed to patient address. Letter can be found in Epic

## 2019-11-18 ENCOUNTER — Other Ambulatory Visit: Payer: Self-pay | Admitting: Family Medicine

## 2019-11-18 NOTE — Telephone Encounter (Signed)
Ok rec p e plus chronic in one mo

## 2019-12-02 ENCOUNTER — Encounter: Payer: Self-pay | Admitting: Family Medicine

## 2019-12-12 ENCOUNTER — Other Ambulatory Visit: Payer: Self-pay | Admitting: Family Medicine

## 2019-12-14 NOTE — Telephone Encounter (Signed)
Please schedule appt and then route back to nurses to send in one refill

## 2019-12-14 NOTE — Telephone Encounter (Signed)
Left message

## 2019-12-21 NOTE — Telephone Encounter (Signed)
Last labs 06/12/19 lipid, liver, a1c

## 2019-12-21 NOTE — Telephone Encounter (Signed)
CPE scheduled 3/5. Does pt need lab work?

## 2019-12-21 NOTE — Telephone Encounter (Signed)
Does he need any lab work

## 2019-12-22 ENCOUNTER — Telehealth: Payer: Self-pay | Admitting: *Deleted

## 2019-12-22 DIAGNOSIS — Z125 Encounter for screening for malignant neoplasm of prostate: Secondary | ICD-10-CM

## 2019-12-22 DIAGNOSIS — Z79899 Other long term (current) drug therapy: Secondary | ICD-10-CM

## 2019-12-22 DIAGNOSIS — Z0000 Encounter for general adult medical examination without abnormal findings: Secondary | ICD-10-CM

## 2019-12-22 DIAGNOSIS — E119 Type 2 diabetes mellitus without complications: Secondary | ICD-10-CM

## 2019-12-22 DIAGNOSIS — E785 Hyperlipidemia, unspecified: Secondary | ICD-10-CM

## 2019-12-22 NOTE — Telephone Encounter (Signed)
Does pt need bw for his wellness. Last labs 06/12/19 lipid, liver, a1c

## 2019-12-22 NOTE — Telephone Encounter (Signed)
Lip liv m7 psa A1c urine pro

## 2019-12-23 NOTE — Telephone Encounter (Signed)
Orders put in and pt was notified.  

## 2019-12-25 LAB — BASIC METABOLIC PANEL
BUN/Creatinine Ratio: 24 — ABNORMAL HIGH (ref 9–20)
BUN: 17 mg/dL (ref 6–24)
CO2: 23 mmol/L (ref 20–29)
Calcium: 9.1 mg/dL (ref 8.7–10.2)
Chloride: 99 mmol/L (ref 96–106)
Creatinine, Ser: 0.71 mg/dL — ABNORMAL LOW (ref 0.76–1.27)
GFR calc Af Amer: 120 mL/min/{1.73_m2} (ref >59)
GFR calc non Af Amer: 103 mL/min/{1.73_m2} (ref >59)
Glucose: 167 mg/dL — ABNORMAL HIGH (ref 65–99)
Potassium: 4.5 mmol/L (ref 3.5–5.2)
Sodium: 137 mmol/L (ref 134–144)

## 2019-12-25 LAB — MICROALBUMIN / CREATININE URINE RATIO
Creatinine, Urine: 58 mg/dL
Microalb/Creat Ratio: 13 mg/g creat (ref 0–29)
Microalbumin, Urine: 7.7 ug/mL

## 2019-12-25 LAB — LIPID PANEL
Chol/HDL Ratio: 2.9 ratio (ref 0.0–5.0)
Cholesterol, Total: 105 mg/dL (ref 100–199)
HDL: 36 mg/dL — ABNORMAL LOW (ref >39)
LDL Chol Calc (NIH): 51 mg/dL (ref 0–99)
Triglycerides: 96 mg/dL (ref 0–149)
VLDL Cholesterol Cal: 18 mg/dL (ref 5–40)

## 2019-12-25 LAB — HEMOGLOBIN A1C
Est. average glucose Bld gHb Est-mCnc: 189 mg/dL
Hgb A1c MFr Bld: 8.2 % — ABNORMAL HIGH (ref 4.8–5.6)

## 2019-12-25 LAB — PSA: Prostate Specific Ag, Serum: 2 ng/mL (ref 0.0–4.0)

## 2019-12-25 LAB — HEPATIC FUNCTION PANEL
ALT: 20 IU/L (ref 0–44)
AST: 21 IU/L (ref 0–40)
Albumin: 4.8 g/dL (ref 3.8–4.9)
Alkaline Phosphatase: 61 IU/L (ref 39–117)
Bilirubin Total: 0.7 mg/dL (ref 0.0–1.2)
Bilirubin, Direct: 0.22 mg/dL (ref 0.00–0.40)
Total Protein: 7 g/dL (ref 6.0–8.5)

## 2019-12-31 ENCOUNTER — Other Ambulatory Visit: Payer: Self-pay | Admitting: Family Medicine

## 2020-01-01 ENCOUNTER — Ambulatory Visit (INDEPENDENT_AMBULATORY_CARE_PROVIDER_SITE_OTHER): Payer: Managed Care, Other (non HMO) | Admitting: Family Medicine

## 2020-01-01 ENCOUNTER — Encounter: Payer: Self-pay | Admitting: Family Medicine

## 2020-01-01 ENCOUNTER — Other Ambulatory Visit: Payer: Self-pay

## 2020-01-01 VITALS — BP 128/84 | Temp 97.3°F | Ht 70.0 in | Wt 189.0 lb

## 2020-01-01 DIAGNOSIS — E1165 Type 2 diabetes mellitus with hyperglycemia: Secondary | ICD-10-CM

## 2020-01-01 DIAGNOSIS — Z0000 Encounter for general adult medical examination without abnormal findings: Secondary | ICD-10-CM

## 2020-01-01 DIAGNOSIS — E785 Hyperlipidemia, unspecified: Secondary | ICD-10-CM | POA: Diagnosis not present

## 2020-01-01 DIAGNOSIS — I10 Essential (primary) hypertension: Secondary | ICD-10-CM | POA: Diagnosis not present

## 2020-01-01 MED ORDER — ATORVASTATIN CALCIUM 40 MG PO TABS: ORAL_TABLET | ORAL | 1 refills | Status: DC

## 2020-01-01 MED ORDER — JARDIANCE 25 MG PO TABS: 25.0000 mg | ORAL_TABLET | Freq: Every day | ORAL | 1 refills | Status: DC

## 2020-01-01 MED ORDER — GLIPIZIDE 5 MG PO TABS: 10.0000 mg | ORAL_TABLET | Freq: Two times a day (BID) | ORAL | 1 refills | Status: DC

## 2020-01-01 MED ORDER — METFORMIN HCL 500 MG PO TABS: ORAL_TABLET | ORAL | 1 refills | Status: DC

## 2020-01-01 NOTE — Progress Notes (Signed)
Subjective:    Patient ID: Grant Torres, male    DOB: Oct 05, 1961, 59 y.o.   MRN: [id number]  Diabetes He presents for his follow-up diabetic visit. He has type 2 diabetes mellitus. There are no hypoglycemic associated symptoms. There are no diabetic associated symptoms. There are no hypoglycemic complications. There are no diabetic complications. He participates in exercise daily (walks daily ). He does not see a podiatrist.Eye exam is current.   The patient comes in today for a wellness visit.    A review of their health history was completed.  A review of medications was also completed.  Any needed refills; none at this time  Eating habits: trying to do better; cooking more at home; trying to eat more chicken and fish  Falls/  MVA accidents in past few months: none  Regular exercise: walks daily at work   Specialist pt sees on regular basis: endocrinologist once a year for thyroid  Walking reg  , lowerig  Stress level and helping  Preventative health issues were discussed.   Additional concerns:   Results for orders placed or performed in visit on 12/22/19  Lipid panel  Result Value Ref Range   Cholesterol, Total 105 100 - 199 mg/dL   Triglycerides 96 0 - 149 mg/dL   HDL 36 (L) >39 mg/dL   VLDL Cholesterol Cal 18 5 - 40 mg/dL   LDL Chol Calc (NIH) 51 0 - 99 mg/dL   Chol/HDL Ratio 2.9 0.0 - 5.0 ratio  Hepatic function panel  Result Value Ref Range   Total Protein 7.0 6.0 - 8.5 g/dL   Albumin 4.8 3.8 - 4.9 g/dL   Bilirubin Total 0.7 0.0 - 1.2 mg/dL   Bilirubin, Direct 0.22 0.00 - 0.40 mg/dL   Alkaline Phosphatase 61 39 - 117 IU/L   AST 21 0 - 40 IU/L   ALT 20 0 - 44 IU/L  Basic metabolic panel  Result Value Ref Range   Glucose 167 (H) 65 - 99 mg/dL   BUN 17 6 - 24 mg/dL   Creatinine, Ser 0.71 (L) 0.76 - 1.27 mg/dL   GFR calc non Af Amer 103 >59 mL/min/1.73   GFR calc Af Amer 120 >59 mL/min/1.73   BUN/Creatinine Ratio 24 (H) 9 - 20   Sodium 137 134 - 144  mmol/L   Potassium 4.5 3.5 - 5.2 mmol/L   Chloride 99 96 - 106 mmol/L   CO2 23 20 - 29 mmol/L   Calcium 9.1 8.7 - 10.2 mg/dL  PSA  Result Value Ref Range   Prostate Specific Ag, Serum 2.0 0.0 - 4.0 ng/mL  Hemoglobin A1c  Result Value Ref Range   Hgb A1c MFr Bld 8.2 (H) 4.8 - 5.6 %   Est. average glucose Bld gHb Est-mCnc 189 mg/dL  Microalbumin / creatinine urine ratio  Result Value Ref Range   Creatinine, Urine 58.0 Not Estab. mg/dL   Microalbumin, Urine 7.7 Not Estab. ug/mL   Microalb/Creat Ratio 13 0 - 29 mg/g creat    Review of Systems No headache, no major weight loss or weight gain, no chest pain no back pain abdominal pain no change in bowel habits complete ROS otherwise negative     Objective:   Physical Exam Vitals reviewed.  Constitutional:      Appearance: He is well-developed.  HENT:     Head: Normocephalic and atraumatic.     Right Ear: External ear normal.     Left Ear: External ear normal.  Nose: Nose normal.  Eyes:     Pupils: Pupils are equal, round, and reactive to light.  Neck:     Thyroid: No thyromegaly.  Cardiovascular:     Rate and Rhythm: Normal rate and regular rhythm.     Heart sounds: Normal heart sounds. No murmur.  Pulmonary:     Effort: Pulmonary effort is normal. No respiratory distress.     Breath sounds: Normal breath sounds. No wheezing.  Abdominal:     General: Bowel sounds are normal. There is no distension.     Palpations: Abdomen is soft. There is no mass.     Tenderness: There is no abdominal tenderness.  Genitourinary:    Penis: Normal.   Musculoskeletal:        General: Normal range of motion.     Cervical back: Normal range of motion and neck supple.  Lymphadenopathy:     Cervical: No cervical adenopathy.  Skin:    General: Skin is warm and dry.     Findings: No erythema.  Neurological:     Mental Status: He is alert.     Motor: No abnormal muscle tone.  Psychiatric:        Behavior: Behavior normal.         Judgment: Judgment normal.           Assessment & Plan:  Impression wellness exam.  Diet discussed.  Exercise discussed.  Blood work reviewed.  Up-to-date on his colonoscopy.  2.  Type 2 diabetes.  Suboptimal.  A1c elevated.  Patient already on maximum dose of 3 medications.  I basically told him the next step is to add long-acting insulin.  Patient reluctant to do this.  He is can work hard on his own dietary and exercise efforts and neck 6 months in hopes of improvement  3.  Hypertension good control discussed to maintain same meds  4.  Hyperlipidemia.  Good control discussed compliance discussed  Follow-up in 6 months

## 2020-01-29 ENCOUNTER — Ambulatory Visit: Payer: Managed Care, Other (non HMO) | Attending: Internal Medicine

## 2020-01-29 ENCOUNTER — Other Ambulatory Visit: Payer: Self-pay

## 2020-01-29 DIAGNOSIS — Z20822 Contact with and (suspected) exposure to covid-19: Secondary | ICD-10-CM

## 2020-01-30 LAB — SARS-COV-2, NAA 2 DAY TAT

## 2020-01-30 LAB — NOVEL CORONAVIRUS, NAA: SARS-CoV-2, NAA: NOT DETECTED

## 2020-02-01 ENCOUNTER — Encounter: Payer: Self-pay | Admitting: Family Medicine

## 2020-02-03 ENCOUNTER — Telehealth: Payer: Self-pay | Admitting: *Deleted

## 2020-02-03 NOTE — Telephone Encounter (Signed)
Left message to return call to let pt know his letter for work is ready to pickup. Pt is in quarantine until April 25th so need to figure out how pt wants to get letter. Letter at front window.

## 2020-02-03 NOTE — Telephone Encounter (Signed)
I sent pt a mychart message about the note

## 2020-02-10 ENCOUNTER — Encounter: Payer: Self-pay | Admitting: Family Medicine

## 2020-02-11 ENCOUNTER — Encounter: Payer: Self-pay | Admitting: Family Medicine

## 2020-07-11 ENCOUNTER — Other Ambulatory Visit: Payer: Self-pay | Admitting: *Deleted

## 2020-07-11 LAB — HM DIABETES EYE EXAM

## 2020-07-11 MED ORDER — ATORVASTATIN CALCIUM 40 MG PO TABS: ORAL_TABLET | ORAL | 0 refills | Status: DC

## 2020-07-11 MED ORDER — METFORMIN HCL 500 MG PO TABS: ORAL_TABLET | ORAL | 0 refills | Status: DC

## 2020-07-11 MED ORDER — GLIPIZIDE 5 MG PO TABS
10.0000 mg | ORAL_TABLET | Freq: Two times a day (BID) | ORAL | 0 refills | Status: DC
Start: 2020-07-11 — End: 2020-10-26

## 2020-07-20 ENCOUNTER — Other Ambulatory Visit: Payer: Self-pay | Admitting: Endocrinology

## 2020-07-22 ENCOUNTER — Telehealth: Payer: Self-pay | Admitting: Family Medicine

## 2020-07-22 MED ORDER — ENALAPRIL MALEATE 20 MG PO TABS
ORAL_TABLET | ORAL | 0 refills | Status: DC
Start: 2020-07-22 — End: 2020-08-15

## 2020-07-22 NOTE — Addendum Note (Signed)
Addended by: Erven Colla on: 07/22/2020 05:22 PM   Modules accepted: Orders

## 2020-07-22 NOTE — Telephone Encounter (Signed)
[facility] requesting refill on Enalapril 20 mg tablet. Take one tablet each day. Pt has appt scheduled for 08/01/20. Please advise. Thank you

## 2020-07-25 ENCOUNTER — Telehealth: Payer: Self-pay | Admitting: Family Medicine

## 2020-07-25 DIAGNOSIS — E785 Hyperlipidemia, unspecified: Secondary | ICD-10-CM

## 2020-07-25 DIAGNOSIS — Z125 Encounter for screening for malignant neoplasm of prostate: Secondary | ICD-10-CM

## 2020-07-25 DIAGNOSIS — Z79899 Other long term (current) drug therapy: Secondary | ICD-10-CM

## 2020-07-25 DIAGNOSIS — E119 Type 2 diabetes mellitus without complications: Secondary | ICD-10-CM

## 2020-07-25 DIAGNOSIS — I10 Essential (primary) hypertension: Secondary | ICD-10-CM

## 2020-07-25 MED ORDER — EMPAGLIFLOZIN 25 MG PO TABS
25.0000 mg | ORAL_TABLET | Freq: Every day | ORAL | 0 refills | Status: DC
Start: 2020-07-25 — End: 2020-11-01

## 2020-07-25 NOTE — Telephone Encounter (Signed)
[facility] requesting refill on Jardiance 25 mg tablet. Pt last seen 01/01/20 for wellness; has appt 08/01/20. Please advise. Thank you

## 2020-07-26 NOTE — Telephone Encounter (Signed)
Lab orders placed and mailed to pt 

## 2020-08-01 ENCOUNTER — Other Ambulatory Visit: Payer: Self-pay

## 2020-08-01 ENCOUNTER — Ambulatory Visit (INDEPENDENT_AMBULATORY_CARE_PROVIDER_SITE_OTHER): Payer: Managed Care, Other (non HMO) | Admitting: Family Medicine

## 2020-08-01 ENCOUNTER — Encounter: Payer: Self-pay | Admitting: Family Medicine

## 2020-08-01 VITALS — BP 124/78 | HR 93 | Temp 98.1°F | Wt 182.2 lb

## 2020-08-01 DIAGNOSIS — E038 Other specified hypothyroidism: Secondary | ICD-10-CM

## 2020-08-01 DIAGNOSIS — E119 Type 2 diabetes mellitus without complications: Secondary | ICD-10-CM | POA: Diagnosis not present

## 2020-08-01 DIAGNOSIS — E785 Hyperlipidemia, unspecified: Secondary | ICD-10-CM | POA: Diagnosis not present

## 2020-08-01 LAB — POCT GLYCOSYLATED HEMOGLOBIN (HGB A1C): Hemoglobin A1C: 6 % — AB (ref 4.0–5.6)

## 2020-08-01 NOTE — Progress Notes (Signed)
Patient ID: Grant Torres, male    DOB: [birth year], 59 y.o.   MRN: [id number]   Chief Complaint  Patient presents with   Diabetes    Patient comes in today to follow up on diabetes. Patient was unable to get blood work done prior to appointment.    Subjective:    HPI DM2- doing well.  Taking meds. BG- at home 110-120 Didn't have labs yet.  Will get after appt today  a1c - 6.0 and lat visit 8.2 in 2/21. Had dm2 since 1998. Has dec carbs. Eating more protein for snacks More nuts, unsweetened fruit. Walking at work. At lunch doing more walking. Lost some weight with these changes.  No sores or ulcers on feet.  No rashes on body. Had eye exam- 9/21- negative for retinopathy. Slight cataract, but just monitoring.  Hypothyroidism- seeing endo.  Taking levothyroxine.  No SEs.   Medical History [place] has a past medical history of Diabetes mellitus without complication ([place]), Hyperlipidemia, Hypertension, and Microproteinuria.   Outpatient Encounter Medications as of 08/01/2020  Medication Sig   aspirin EC 81 MG tablet Take 81 mg by mouth daily.   atorvastatin (LIPITOR) 40 MG tablet TAKE 1/2 TABLET DAILY BY MOUTH   blood glucose meter kit and supplies KIT Dispense based on patient and insurance preference. Use to test blood sugar once daily. ICD 10 code E11.9   Blood Glucose Monitoring Suppl (ONETOUCH VERIO) w/Device KIT TEST ONCE DAILY   empagliflozin (JARDIANCE) 25 MG TABS tablet Take 1 tablet (25 mg total) by mouth daily.   enalapril (VASOTEC) 20 MG tablet TAKE 1 TABLET BY MOUTH EVERY DAY. *PLEASE SCHEDULE PHYSICAL PLUS CHRONIC VISIT FOR NEXT MONTH*   glipiZIDE (GLUCOTROL) 5 MG tablet Take 2 tablets (10 mg total) by mouth 2 (two) times daily before a meal.   glucose blood test strip Test blood sugar once daily   levothyroxine (SYNTHROID) 75 MCG tablet TAKE 1 TABLET (75 MCG TOTAL) BY MOUTH DAILY BEFORE BREAKFAST.   metFORMIN (GLUCOPHAGE) 500 MG tablet TAKE 2 TABLETS  (1,000 MG TOTAL) BY MOUTH 2 (TWO) TIMES DAILY WITH A MEAL.   OneTouch Delica Lancets 41D MISC USE TO TEST BLOOD SUGAR ONCE A DAY   No facility-administered encounter medications on file as of 08/01/2020.   Results for orders placed or performed in visit on 08/01/20  POCT glycosylated hemoglobin (Hb A1C)  Result Value Ref Range   Hemoglobin A1C 6.0 (A) 4.0 - 5.6 %   HbA1c POC (<> result, manual entry)     HbA1c, POC (prediabetic range)     HbA1c, POC (controlled diabetic range)       Review of Systems  Constitutional: Negative for chills and fever.  HENT: Negative for congestion, rhinorrhea and sore throat.   Respiratory: Negative for cough, shortness of breath and wheezing.   Cardiovascular: Negative for chest pain and leg swelling.  Gastrointestinal: Negative for abdominal pain, diarrhea, nausea and vomiting.  Genitourinary: Negative for dysuria and frequency.  Skin: Negative for rash.  Neurological: Negative for dizziness, weakness and headaches.     Vitals BP 124/78    Pulse 93    Temp 98.1 F (36.7 C)    Wt 182 lb 3.2 oz (82.6 kg)    SpO2 98%    BMI 26.14 kg/m   Objective:   Physical Exam Vitals and nursing note reviewed.  Constitutional:      General: He is not in acute distress.    Appearance: Normal appearance. He is  not ill-appearing.  HENT:     Head: Normocephalic.     Nose: Nose normal. No congestion.     Mouth/Throat:     Mouth: Mucous membranes are moist.     Pharynx: No oropharyngeal exudate.  Eyes:     Extraocular Movements: Extraocular movements intact.     Conjunctiva/sclera: Conjunctivae normal.     Pupils: Pupils are equal, round, and reactive to light.  Cardiovascular:     Rate and Rhythm: Normal rate and regular rhythm.     Pulses: Normal pulses.     Heart sounds: Normal heart sounds. No murmur heard.   Pulmonary:     Effort: Pulmonary effort is normal.     Breath sounds: Normal breath sounds. No wheezing, rhonchi or rales.    Musculoskeletal:        General: Normal range of motion.     Right lower leg: No edema.     Left lower leg: No edema.  Skin:    General: Skin is warm and dry.     Findings: No rash.  Neurological:     General: No focal deficit present.     Mental Status: He is alert and oriented to person, place, and time.     Cranial Nerves: No cranial nerve deficit.  Psychiatric:        Mood and Affect: Mood normal.        Behavior: Behavior normal.        Thought Content: Thought content normal.        Judgment: Judgment normal.      Assessment and Plan   1. Diabetes mellitus without complication ([place]) - POCT glycosylated hemoglobin (Hb A1C)  2. Hyperlipidemia, unspecified hyperlipidemia type  3. Other specified hypothyroidism   [facility].  DM2- improved and a1c dec to 6.0.  Cont meds and diet modification. HLD- stable cont meds. Hypothyroid- cont f/u with endo.  Cont meds.  Hm- uptodate on colonscopy due 2024.  Pt to get labs today.  Call with results.  Fill meds after seeing labs.  F/u 46mo

## 2020-08-02 LAB — CBC WITH DIFFERENTIAL/PLATELET
Basophils Absolute: 0 10*3/uL (ref 0.0–0.2)
Basos: 0 %
EOS (ABSOLUTE): 0.1 10*3/uL (ref 0.0–0.4)
Eos: 2 %
Hematocrit: 45.7 % (ref 37.5–51.0)
Hemoglobin: 15.2 g/dL (ref 13.0–17.7)
Immature Grans (Abs): 0 10*3/uL (ref 0.0–0.1)
Immature Granulocytes: 0 %
Lymphocytes Absolute: 1.6 10*3/uL (ref 0.7–3.1)
Lymphs: 34 %
MCH: 29.2 pg (ref 26.6–33.0)
MCHC: 33.3 g/dL (ref 31.5–35.7)
MCV: 88 fL (ref 79–97)
Monocytes Absolute: 0.3 10*3/uL (ref 0.1–0.9)
Monocytes: 7 %
Neutrophils Absolute: 2.6 10*3/uL (ref 1.4–7.0)
Neutrophils: 57 %
Platelets: 214 10*3/uL (ref 150–450)
RBC: 5.2 x10E6/uL (ref 4.14–5.80)
RDW: 13.6 % (ref 11.6–15.4)
WBC: 4.6 10*3/uL (ref 3.4–10.8)

## 2020-08-02 LAB — COMPREHENSIVE METABOLIC PANEL
ALT: 23 IU/L (ref 0–44)
AST: 20 IU/L (ref 0–40)
Albumin/Globulin Ratio: 2 (ref 1.2–2.2)
Albumin: 4.9 g/dL (ref 3.8–4.9)
Alkaline Phosphatase: 56 IU/L (ref 44–121)
BUN/Creatinine Ratio: 23 — ABNORMAL HIGH (ref 9–20)
BUN: 16 mg/dL (ref 6–24)
Bilirubin Total: 0.7 mg/dL (ref 0.0–1.2)
CO2: 23 mmol/L (ref 20–29)
Calcium: 9.4 mg/dL (ref 8.7–10.2)
Chloride: 102 mmol/L (ref 96–106)
Creatinine, Ser: 0.71 mg/dL — ABNORMAL LOW (ref 0.76–1.27)
GFR calc Af Amer: 120 mL/min/{1.73_m2} (ref >59)
GFR calc non Af Amer: 103 mL/min/{1.73_m2} (ref >59)
Globulin, Total: 2.5 g/dL (ref 1.5–4.5)
Glucose: 157 mg/dL — ABNORMAL HIGH (ref 65–99)
Potassium: 4.6 mmol/L (ref 3.5–5.2)
Sodium: 140 mmol/L (ref 134–144)
Total Protein: 7.4 g/dL (ref 6.0–8.5)

## 2020-08-02 LAB — HEMOGLOBIN A1C
Est. average glucose Bld gHb Est-mCnc: 140 mg/dL
Hgb A1c MFr Bld: 6.5 % — ABNORMAL HIGH (ref 4.8–5.6)

## 2020-08-02 LAB — MICROALBUMIN / CREATININE URINE RATIO
Creatinine, Urine: 62.6 mg/dL
Microalb/Creat Ratio: 8 mg/g creat (ref 0–29)
Microalbumin, Urine: 5 ug/mL

## 2020-08-02 LAB — LIPID PANEL
Chol/HDL Ratio: 3 ratio (ref 0.0–5.0)
Cholesterol, Total: 115 mg/dL (ref 100–199)
HDL: 38 mg/dL — ABNORMAL LOW (ref >39)
LDL Chol Calc (NIH): 63 mg/dL (ref 0–99)
Triglycerides: 68 mg/dL (ref 0–149)
VLDL Cholesterol Cal: 14 mg/dL (ref 5–40)

## 2020-08-02 LAB — PSA: Prostate Specific Ag, Serum: 2.8 ng/mL (ref 0.0–4.0)

## 2020-08-13 ENCOUNTER — Other Ambulatory Visit: Payer: Self-pay | Admitting: Family Medicine

## 2020-08-15 ENCOUNTER — Other Ambulatory Visit: Payer: Self-pay | Admitting: Family Medicine

## 2020-08-16 ENCOUNTER — Encounter: Payer: Self-pay | Admitting: Endocrinology

## 2020-08-16 ENCOUNTER — Other Ambulatory Visit: Payer: Self-pay

## 2020-08-16 ENCOUNTER — Ambulatory Visit (INDEPENDENT_AMBULATORY_CARE_PROVIDER_SITE_OTHER): Payer: Managed Care, Other (non HMO) | Admitting: Endocrinology

## 2020-08-16 VITALS — BP 118/70 | HR 83 | Ht 70.0 in | Wt 182.0 lb

## 2020-08-16 DIAGNOSIS — E038 Other specified hypothyroidism: Secondary | ICD-10-CM

## 2020-08-16 LAB — TSH: TSH: 2.66 u[IU]/mL (ref 0.35–4.50)

## 2020-08-16 LAB — T4, FREE: Free T4: 1.14 ng/dL (ref 0.60–1.60)

## 2020-08-16 NOTE — Patient Instructions (Addendum)
Blood tests are requested for you today.  We'll let you know about the results.  We can skip the ultrasound this year.  Please come back for a follow-up appointment in 1 year.

## 2020-08-16 NOTE — Progress Notes (Signed)
Subjective:    Patient ID: Grant Torres, male    DOB: [birth year], 59 y.o.   MRN: [id number]  HPI Pt returns for f/u of MNG (dx'ed 2017; he has been on synthroid, also since 2017; bx then showed BENIGN FOLLICULAR NODULE ([employer]);  [place] in 2019 and 2020 showed no change).  pt states he feels well in general.  He does not notice the goiter.   Past Medical History:  Diagnosis Date  . Diabetes mellitus without complication ([place])   . Hyperlipidemia   . Hypertension   . Microproteinuria     Past Surgical History:  Procedure Laterality Date  . CERVICAL SPINE SURGERY  11/25/2015  . COLONOSCOPY N/A 04/10/2018   Procedure: COLONOSCOPY;  Surgeon: Rogene Houston, MD;  [facility];  Service: Endoscopy;  Laterality: N/A;  200  . POLYPECTOMY  04/10/2018   Procedure: POLYPECTOMY;  Surgeon: Rogene Houston, MD;  [facility];  Service: Endoscopy;;  colon  . VASECTOMY    . WISDOM TOOTH EXTRACTION      Social History   Socioeconomic History  . Marital status: Married    Spouse name: Not on file  . Number of children: Not on file  . Years of education: Not on file  . Highest education level: Not on file  Occupational History  . Not on file  Tobacco Use  . Smoking status: Never Smoker  . Smokeless tobacco: Never Used  Vaping Use  . Vaping Use: Never used  Substance and Sexual Activity  . Alcohol use: Not on file    Comment: occasionally  . Drug use: Never  . Sexual activity: Not on file  Other Topics Concern  . Not on file  Social History Narrative  . Not on file   Social Determinants of Health   Financial Resource Strain:   . Difficulty of Paying Living Expenses: Not on file  Food Insecurity:   . Worried About Charity fundraiser in the Last Year: Not on file  . Ran Out of Food in the Last Year: Not on file  Transportation Needs:   . Lack of Transportation (Medical): Not on file  . Lack of Transportation (Non-Medical): Not on file    Physical Activity:   . Days of Exercise per Week: Not on file  . Minutes of Exercise per Session: Not on file  Stress:   . Feeling of Stress : Not on file  Social Connections:   . Frequency of Communication with Friends and Family: Not on file  . Frequency of Social Gatherings with Friends and Family: Not on file  . Attends Religious Services: Not on file  . Active Member of Clubs or Organizations: Not on file  . Attends Archivist Meetings: Not on file  . Marital Status: Not on file  Intimate Partner Violence:   . Fear of Current or Ex-Partner: Not on file  . Emotionally Abused: Not on file  . Physically Abused: Not on file  . Sexually Abused: Not on file    Current Outpatient Medications on File Prior to Visit  Medication Sig Dispense Refill  . aspirin EC 81 MG tablet Take 81 mg by mouth daily.    Marland Kitchen atorvastatin (LIPITOR) 40 MG tablet TAKE 1/2 TABLET DAILY BY MOUTH 15 tablet 5  . blood glucose meter kit and supplies KIT Dispense based on patient and insurance preference. Use to test blood sugar once daily. ICD 10 code E11.9 1 each 5  .  Blood Glucose Monitoring Suppl (ONETOUCH VERIO) w/Device KIT TEST ONCE DAILY 1 kit 0  . empagliflozin (JARDIANCE) 25 MG TABS tablet Take 1 tablet (25 mg total) by mouth daily. 30 tablet 0  . enalapril (VASOTEC) 20 MG tablet TAKE 1 TABLET BY MOUTH EVERY DAY. *PLEASE SCHEDULE PHYSICAL PLUS CHRONIC VISIT FOR NEXT MONTH* 30 tablet 5  . glipiZIDE (GLUCOTROL) 5 MG tablet Take 2 tablets (10 mg total) by mouth 2 (two) times daily before a meal. 120 tablet 0  . glucose blood test strip Test blood sugar once daily 100 each 5  . levothyroxine (SYNTHROID) 75 MCG tablet TAKE 1 TABLET (75 MCG TOTAL) BY MOUTH DAILY BEFORE BREAKFAST. 90 tablet 1  . metFORMIN (GLUCOPHAGE) 500 MG tablet TAKE 2 TABLETS (1,000 MG TOTAL) BY MOUTH 2 (TWO) TIMES DAILY WITH A MEAL. 120 tablet 0  . OneTouch Delica Lancets 22E MISC USE TO TEST BLOOD SUGAR ONCE A DAY 100 each 0    No current facility-administered medications on file prior to visit.    No Known Allergies  Family History  Problem Relation Age of Onset  . Hypertension Sister   . Colon cancer Neg Hx   . Thyroid disease Neg Hx     BP 118/70   Pulse 83   Ht 5' 10"  (1.778 m)   Wt 182 lb (82.6 kg)   SpO2 97%   BMI 26.11 kg/m    Review of Systems Denies sob    Objective:   Physical Exam VITAL SIGNS:  See vs page GENERAL: no distress NECK: thyroid is 5x normal size, with multinodular surface.     Lab Results  Component Value Date   TSH 2.66 08/16/2020   T3TOTAL 109 01/13/2016   T4TOTAL 8.1 01/13/2016       Assessment & Plan:  MNG: clinically stable.  He declines [place] this year.   Patient Instructions  Blood tests are requested for you today.  We'll let you know about the results.  We can skip the ultrasound this year.  Please come back for a follow-up appointment in 1 year.

## 2020-09-11 ENCOUNTER — Other Ambulatory Visit: Payer: Self-pay | Admitting: Family Medicine

## 2020-10-24 NOTE — Telephone Encounter (Signed)
Left message on prescriber voicemail giving OK to switch medications.

## 2020-10-25 ENCOUNTER — Other Ambulatory Visit: Payer: Self-pay | Admitting: Family Medicine

## 2020-10-26 ENCOUNTER — Other Ambulatory Visit: Payer: Self-pay | Admitting: *Deleted

## 2020-10-26 MED ORDER — GLIPIZIDE 5 MG PO TABS
10.0000 mg | ORAL_TABLET | Freq: Two times a day (BID) | ORAL | 0 refills | Status: DC
Start: 2020-10-26 — End: 2020-11-21

## 2020-11-01 ENCOUNTER — Other Ambulatory Visit: Payer: Self-pay | Admitting: *Deleted

## 2020-11-01 MED ORDER — EMPAGLIFLOZIN 25 MG PO TABS: 25.0000 mg | ORAL_TABLET | Freq: Every day | ORAL | 0 refills | Status: DC

## 2020-11-20 ENCOUNTER — Other Ambulatory Visit: Payer: Self-pay | Admitting: Family Medicine

## 2020-11-21 MED ORDER — GLIPIZIDE 5 MG PO TABS: ORAL_TABLET | ORAL | 0 refills | Status: DC

## 2020-11-21 NOTE — Addendum Note (Signed)
Addended by: Dairl Ponder on: 11/21/2020 02:23 PM   Modules accepted: Orders

## 2020-11-23 ENCOUNTER — Other Ambulatory Visit: Payer: Self-pay | Admitting: Family Medicine

## 2020-12-24 ENCOUNTER — Other Ambulatory Visit: Payer: Self-pay | Admitting: Family Medicine

## 2020-12-26 MED ORDER — GLIPIZIDE 5 MG PO TABS: ORAL_TABLET | ORAL | 1 refills | Status: DC

## 2021-01-01 ENCOUNTER — Other Ambulatory Visit: Payer: Self-pay | Admitting: Family Medicine

## 2021-01-01 ENCOUNTER — Other Ambulatory Visit: Payer: Self-pay | Admitting: Endocrinology

## 2021-01-30 ENCOUNTER — Other Ambulatory Visit: Payer: Self-pay

## 2021-01-30 ENCOUNTER — Ambulatory Visit: Payer: Managed Care, Other (non HMO) | Admitting: Family Medicine

## 2021-01-30 VITALS — BP 112/74 | HR 83 | Temp 97.7°F | Ht 70.0 in | Wt 178.0 lb

## 2021-01-30 DIAGNOSIS — E119 Type 2 diabetes mellitus without complications: Secondary | ICD-10-CM

## 2021-01-30 DIAGNOSIS — E785 Hyperlipidemia, unspecified: Secondary | ICD-10-CM | POA: Diagnosis not present

## 2021-01-30 DIAGNOSIS — I10 Essential (primary) hypertension: Secondary | ICD-10-CM | POA: Diagnosis not present

## 2021-01-30 DIAGNOSIS — E038 Other specified hypothyroidism: Secondary | ICD-10-CM | POA: Diagnosis not present

## 2021-01-30 MED ORDER — METFORMIN HCL 500 MG PO TABS: 2.0000 | ORAL_TABLET | Freq: Two times a day (BID) | ORAL | 1 refills | Status: DC

## 2021-01-30 MED ORDER — EMPAGLIFLOZIN 25 MG PO TABS: 25.0000 mg | ORAL_TABLET | Freq: Every day | ORAL | 1 refills | Status: DC

## 2021-01-30 MED ORDER — ATORVASTATIN CALCIUM 40 MG PO TABS: ORAL_TABLET | ORAL | 1 refills | Status: DC

## 2021-01-30 MED ORDER — ENALAPRIL MALEATE 20 MG PO TABS: ORAL_TABLET | ORAL | 1 refills | Status: DC

## 2021-01-30 NOTE — Progress Notes (Signed)
Patient ID: Grant Torres, male    DOB: [birth year], 60 y.o.   MRN: [id number]   Chief Complaint  Patient presents with  . Diabetes    Follow up    Subjective:   HPI Diabetes-  Hypothyroid- seeing Dr. Loanne Torres, endo. On thyroid meds. H/o multinodular goiter and doing well. F/u in 1 yr with endo.  Not had covid.  Had flu vaccine and had a massage and next day felt very sore and dizzy. Was in bed next day.  Had 2021- my eye doctor.  -no retinopathy per pt.   Dm2- Compliant with medications. Checking blood glucose.   Not seeing any high or low numbers.  Denies polyuria or polydipsia.  Eye exam: up to date per pt. Foot exam: no concerns  HTN Pt compliant with BP meds.  No SEs Denies chest pain, sob, LE swelling, or blurry vision.  HLD- doing well no new concerns.  Compliant with meds. No chest pain, palpitations, myalgias or joint pains.   Medical History Grant Torres has a past medical history of Diabetes mellitus without complication ([place]), Hyperlipidemia, Hypertension, and Microproteinuria.   Outpatient Encounter Medications as of 01/30/2021  Medication Sig  . aspirin EC 81 MG tablet Take 81 mg by mouth daily.  . blood glucose meter kit and supplies KIT Dispense based on patient and insurance preference. Use to test blood sugar once daily. ICD 10 code E11.9  . Blood Glucose Monitoring Suppl (ONETOUCH VERIO) w/Device KIT TEST ONCE DAILY  . glipiZIDE (GLUCOTROL) 5 MG tablet TAKE 2 TABLETS BY MOUTH TWICE DAILY BEFORE A MEAL  . glucose blood test strip Test blood sugar once daily  . levothyroxine (SYNTHROID) 75 MCG tablet TAKE 1 TABLET (75 MCG TOTAL) BY MOUTH DAILY BEFORE BREAKFAST.  Glory Rosebush Delica Lancets 26Z MISC USE TO TEST BLOOD SUGAR ONCE A DAY  . [DISCONTINUED] atorvastatin (LIPITOR) 40 MG tablet TAKE 1/2 TABLET DAILY BY MOUTH  . [DISCONTINUED] empagliflozin (JARDIANCE) 25 MG TABS tablet Take 1 tablet (25 mg total) by mouth daily.  . [DISCONTINUED] enalapril  (VASOTEC) 20 MG tablet TAKE 1 TABLET BY MOUTH EVERY DAY. *PLEASE SCHEDULE PHYSICAL PLUS CHRONIC VISIT FOR NEXT MONTH*  . [DISCONTINUED] metFORMIN (GLUCOPHAGE) 500 MG tablet TAKE 2 TABLETS (1,000 MG TOTAL) BY MOUTH 2 (TWO) TIMES DAILY WITH A MEAL.  Marland Kitchen atorvastatin (LIPITOR) 40 MG tablet Take 1/2 tab p.o. daily.  . empagliflozin (JARDIANCE) 25 MG TABS tablet Take 1 tablet (25 mg total) by mouth daily.  . enalapril (VASOTEC) 20 MG tablet TAKE 1 TABLET BY MOUTH EVERY DAY.  . metFORMIN (GLUCOPHAGE) 500 MG tablet Take 2 tablets (1,000 mg total) by mouth 2 (two) times daily with a meal.   No facility-administered encounter medications on file as of 01/30/2021.     Review of Systems  Constitutional: Negative for chills and fever.  HENT: Negative for congestion, rhinorrhea and sore throat.   Respiratory: Negative for cough, shortness of breath and wheezing.   Cardiovascular: Negative for chest pain and leg swelling.  Gastrointestinal: Negative for abdominal pain, diarrhea, nausea and vomiting.  Genitourinary: Negative for dysuria and frequency.  Skin: Negative for rash.  Neurological: Negative for dizziness, weakness and headaches.     Vitals BP 112/74   Pulse 83   Temp 97.7 F (36.5 C)   Ht $R'5\' 10"'Fw$  (1.778 m)   Wt 178 lb (80.7 kg)   SpO2 99%   BMI 25.54 kg/m   Objective:   Physical Exam Vitals and nursing note reviewed.  Constitutional:      General: He is not in acute distress.    Appearance: Normal appearance. He is not ill-appearing.  HENT:     Head: Normocephalic.     Nose: Nose normal. No congestion.     Mouth/Throat:     Mouth: Mucous membranes are moist.     Pharynx: No oropharyngeal exudate.  Eyes:     Extraocular Movements: Extraocular movements intact.     Conjunctiva/sclera: Conjunctivae normal.     Pupils: Pupils are equal, round, and reactive to light.  Cardiovascular:     Rate and Rhythm: Normal rate and regular rhythm.     Pulses: Normal pulses.     Heart  sounds: Normal heart sounds. No murmur heard.   Pulmonary:     Effort: Pulmonary effort is normal.     Breath sounds: Normal breath sounds. No wheezing, rhonchi or rales.  Musculoskeletal:        General: Normal range of motion.     Right lower leg: No edema.     Left lower leg: No edema.  Skin:    General: Skin is warm and dry.     Findings: No rash.  Neurological:     General: No focal deficit present.     Mental Status: He is alert and oriented to person, place, and time.     Cranial Nerves: No cranial nerve deficit.  Psychiatric:        Mood and Affect: Mood normal.        Behavior: Behavior normal.        Thought Content: Thought content normal.        Judgment: Judgment normal.      Assessment and Plan   1. Diabetes mellitus without complication (HCC) - DGL87+FIEP - CBC - Hemoglobin A1c - Lipid panel - Microalbumin, urine  2. Hyperlipidemia, unspecified hyperlipidemia type - Lipid panel  3. Other specified hypothyroidism  4. Essential hypertension, benign    dm2- pt to get labs today. Last labs a1c at goal.  Cont meds.  Will try to get record of last eye exam.  hld- stable. Recheck labs. Cont meds.  Hypothyroid- pt seeing endo yearly.  Stable. Cont with meds.  htn- stable. Cont meds.   Return in about 6 months (around 08/01/2021) for f/u dm2, htn, hld.

## 2021-01-31 LAB — LIPID PANEL
Chol/HDL Ratio: 2.9 ratio (ref 0.0–5.0)
Cholesterol, Total: 118 mg/dL (ref 100–199)
HDL: 41 mg/dL (ref >39)
LDL Chol Calc (NIH): 66 mg/dL (ref 0–99)
Triglycerides: 48 mg/dL (ref 0–149)
VLDL Cholesterol Cal: 11 mg/dL (ref 5–40)

## 2021-01-31 LAB — CBC
Hematocrit: 45.8 % (ref 37.5–51.0)
Hemoglobin: 15.5 g/dL (ref 13.0–17.7)
MCH: 29.5 pg (ref 26.6–33.0)
MCHC: 33.8 g/dL (ref 31.5–35.7)
MCV: 87 fL (ref 79–97)
Platelets: 193 10*3/uL (ref 150–450)
RBC: 5.25 x10E6/uL (ref 4.14–5.80)
RDW: 13.1 % (ref 11.6–15.4)
WBC: 4.9 10*3/uL (ref 3.4–10.8)

## 2021-01-31 LAB — CMP14+EGFR
ALT: 18 IU/L (ref 0–44)
AST: 18 IU/L (ref 0–40)
Albumin/Globulin Ratio: 2 (ref 1.2–2.2)
Albumin: 4.7 g/dL (ref 3.8–4.9)
Alkaline Phosphatase: 54 IU/L (ref 44–121)
BUN/Creatinine Ratio: 21 — ABNORMAL HIGH (ref 9–20)
BUN: 15 mg/dL (ref 6–24)
Bilirubin Total: 0.5 mg/dL (ref 0.0–1.2)
CO2: 24 mmol/L (ref 20–29)
Calcium: 9.4 mg/dL (ref 8.7–10.2)
Chloride: 103 mmol/L (ref 96–106)
Creatinine, Ser: 0.71 mg/dL — ABNORMAL LOW (ref 0.76–1.27)
Globulin, Total: 2.4 g/dL (ref 1.5–4.5)
Glucose: 141 mg/dL — ABNORMAL HIGH (ref 65–99)
Potassium: 4.5 mmol/L (ref 3.5–5.2)
Sodium: 144 mmol/L (ref 134–144)
Total Protein: 7.1 g/dL (ref 6.0–8.5)
eGFR: 106 mL/min/{1.73_m2} (ref >59)

## 2021-01-31 LAB — HEMOGLOBIN A1C
Est. average glucose Bld gHb Est-mCnc: 154 mg/dL
Hgb A1c MFr Bld: 7 % — ABNORMAL HIGH (ref 4.8–5.6)

## 2021-01-31 LAB — MICROALBUMIN, URINE: Microalbumin, Urine: 6.9 ug/mL

## 2021-02-19 ENCOUNTER — Other Ambulatory Visit: Payer: Self-pay | Admitting: Family Medicine

## 2021-02-22 ENCOUNTER — Other Ambulatory Visit: Payer: Self-pay | Admitting: Family Medicine

## 2021-04-21 ENCOUNTER — Other Ambulatory Visit: Payer: Self-pay | Admitting: Family Medicine

## 2021-05-28 ENCOUNTER — Other Ambulatory Visit: Payer: Self-pay

## 2021-05-28 ENCOUNTER — Other Ambulatory Visit: Payer: Self-pay | Admitting: Family Medicine

## 2021-05-29 MED ORDER — GLIPIZIDE 5 MG PO TABS: ORAL_TABLET | ORAL | 3 refills | Status: DC

## 2021-06-25 ENCOUNTER — Other Ambulatory Visit: Payer: Self-pay | Admitting: Endocrinology

## 2021-07-29 ENCOUNTER — Other Ambulatory Visit: Payer: Self-pay | Admitting: Family Medicine

## 2021-08-16 ENCOUNTER — Ambulatory Visit: Payer: Managed Care, Other (non HMO) | Admitting: Endocrinology

## 2021-08-16 ENCOUNTER — Other Ambulatory Visit: Payer: Self-pay

## 2021-08-16 VITALS — BP 146/72 | HR 84 | Ht 70.0 in | Wt 183.8 lb

## 2021-08-16 DIAGNOSIS — E119 Type 2 diabetes mellitus without complications: Secondary | ICD-10-CM | POA: Diagnosis not present

## 2021-08-16 DIAGNOSIS — E042 Nontoxic multinodular goiter: Secondary | ICD-10-CM

## 2021-08-16 DIAGNOSIS — E038 Other specified hypothyroidism: Secondary | ICD-10-CM | POA: Diagnosis not present

## 2021-08-16 LAB — TSH: TSH: 2.22 u[IU]/mL (ref 0.35–5.50)

## 2021-08-16 LAB — T4, FREE: Free T4: 1.1 ng/dL (ref 0.60–1.60)

## 2021-08-16 LAB — HEMOGLOBIN A1C: Hgb A1c MFr Bld: 6.8 % — ABNORMAL HIGH (ref 4.6–6.5)

## 2021-08-16 MED ORDER — GLIPIZIDE 5 MG PO TABS: 10.0000 mg | ORAL_TABLET | Freq: Every day | ORAL | 3 refills | Status: DC

## 2021-08-16 NOTE — Progress Notes (Signed)
Subjective:    Patient ID: Grant Torres, male    DOB: [birth year], 60 y.o.   MRN: [id number]  HPI Pt returns for f/u of MNG (dx'ed 2017; he has been on synthroid, also since 2017; bx then showed beth cat 2;  [place] in 2019 and 2020 showed no change).  pt states he feels well in general.  He does not notice the goiter.   He also requests to f/u DM here (dx'ed 1998; he has never been on insulin).  He says cbg's are in the low-100's.   Past Medical History:  Diagnosis Date   Diabetes mellitus without complication ([name])    Hyperlipidemia    Hypertension    Microproteinuria     Past Surgical History:  Procedure Laterality Date   CERVICAL SPINE SURGERY  11/25/2015   COLONOSCOPY N/A 04/10/2018   Procedure: COLONOSCOPY;  Surgeon: Rogene Houston, MD;  [facility];  Service: Endoscopy;  Laterality: N/A;  200   POLYPECTOMY  04/10/2018   Procedure: POLYPECTOMY;  Surgeon: Rogene Houston, MD;  [facility];  Service: Endoscopy;;  colon   VASECTOMY     WISDOM TOOTH EXTRACTION      Social History   Socioeconomic History   Marital status: Married    Spouse name: Not on file   Number of children: Not on file   Years of education: Not on file   Highest education level: Not on file  Occupational History   Not on file  Tobacco Use   Smoking status: Never   Smokeless tobacco: Never  Vaping Use   Vaping Use: Never used  Substance and Sexual Activity   Alcohol use: Not on file    Comment: occasionally   Drug use: Never   Sexual activity: Not on file  Other Topics Concern   Not on file  Social History Narrative   Not on file   Social Determinants of Health   Financial Resource Strain: Not on file  Food Insecurity: Not on file  Transportation Needs: Not on file  Physical Activity: Not on file  Stress: Not on file  Social Connections: Not on file  Intimate Partner Violence: Not on file    Current Outpatient Medications on File Prior to Visit  Medication Sig  Dispense Refill   aspirin EC 81 MG tablet Take 81 mg by mouth daily.     atorvastatin (LIPITOR) 40 MG tablet Take 1/2 tab p.o. daily. 45 tablet 1   blood glucose meter kit and supplies KIT Dispense based on patient and insurance preference. Use to test blood sugar once daily. ICD 10 code E11.9 1 each 5   Blood Glucose Monitoring Suppl (ONETOUCH VERIO) w/Device KIT TEST ONCE DAILY 1 kit 0   empagliflozin (JARDIANCE) 25 MG TABS tablet Take 1 tablet by mouth once daily 30 tablet 0   enalapril (VASOTEC) 20 MG tablet TAKE 1 TABLET BY MOUTH EVERY DAY. 90 tablet 1   EUTHYROX 75 MCG tablet TAKE 1 TABLET BY MOUTH BEFORE BREAKFAST 90 tablet 0   glucose blood test strip Test blood sugar once daily 100 each 5   metFORMIN (GLUCOPHAGE) 500 MG tablet Take 2 tablets (1,000 mg total) by mouth 2 (two) times daily with a meal. 360 tablet 1   OneTouch Delica Lancets 71Q MISC USE TO TEST BLOOD SUGAR ONCE A DAY 100 each 0   No current facility-administered medications on file prior to visit.    No Known Allergies  Family History  Problem Relation Age of Onset   Hypertension Sister    Colon cancer Neg Hx    Thyroid disease Neg Hx     BP (!) 146/72 (BP Location: Right Arm, Patient Position: Sitting, Cuff Size: Normal)   Pulse 84   Ht 5' 10"  (1.778 m)   Wt 183 lb 12.8 oz (83.4 kg)   SpO2 97%   BMI 26.37 kg/m   Review of Systems Denies sob, N/V, and hoarseness.      Objective:   Physical Exam VITAL SIGNS:  See vs page GENERAL: no distress NECK: thyroid is slightly enlarged, with irreg surface.  2 cm RUP nodule is palpable.    Lab Results  Component Value Date   HGBA1C 6.8 (H) 08/16/2021      Assessment & Plan:  MNG: we discussed.  He declines [place], at least for now. Recheck TFT Type 2 DM: overcontrolled, for this SU-containing regimen   Patient Instructions  Your blood pressure is high today.  Please see your primary care provider soon, to have it rechecked.   Blood tests are requested for  you today.  We'll let you know about the results.   Based on the results, we'll phase out the glipizide as we can, and add another pill if we need to.   Please come back for a follow-up appointment in 1 year.

## 2021-08-16 NOTE — Patient Instructions (Addendum)
Your blood pressure is high today.  Please see your primary care provider soon, to have it rechecked.   Blood tests are requested for you today.  We'll let you know about the results.   Based on the results, we'll phase out the glipizide as we can, and add another pill if we need to.   Please come back for a follow-up appointment in 1 year.

## 2021-08-26 ENCOUNTER — Other Ambulatory Visit: Payer: Self-pay | Admitting: Family Medicine

## 2021-08-29 ENCOUNTER — Other Ambulatory Visit: Payer: Self-pay | Admitting: Nurse Practitioner

## 2021-08-29 ENCOUNTER — Other Ambulatory Visit: Payer: Self-pay | Admitting: Family Medicine

## 2021-09-01 ENCOUNTER — Other Ambulatory Visit: Payer: Self-pay

## 2021-09-01 ENCOUNTER — Telehealth: Payer: Self-pay | Admitting: Endocrinology

## 2021-09-01 DIAGNOSIS — E119 Type 2 diabetes mellitus without complications: Secondary | ICD-10-CM

## 2021-09-01 MED ORDER — METFORMIN HCL 500 MG PO TABS: 1000.0000 mg | ORAL_TABLET | Freq: Two times a day (BID) | ORAL | 1 refills | Status: DC

## 2021-09-01 MED ORDER — EMPAGLIFLOZIN 25 MG PO TABS: 25.0000 mg | ORAL_TABLET | Freq: Every day | ORAL | 0 refills | Status: DC

## 2021-09-01 NOTE — Telephone Encounter (Signed)
Rx sent 

## 2021-09-01 NOTE — Telephone Encounter (Signed)
Pt calling in to request refills of empagliflozin   (JARDIANCE) 25 MG TABS tablet   metFORMIN (GLUCOPHAGE) 500 MG tablet  Pt completely out  [place], [place] - [address] [place], [place] [address]   Pt contact [phone]

## 2021-09-11 ENCOUNTER — Other Ambulatory Visit: Payer: Self-pay | Admitting: Family Medicine

## 2021-09-26 ENCOUNTER — Other Ambulatory Visit: Payer: Self-pay | Admitting: Endocrinology

## 2021-09-28 ENCOUNTER — Other Ambulatory Visit: Payer: Self-pay

## 2021-10-02 ENCOUNTER — Other Ambulatory Visit: Payer: Self-pay | Admitting: Endocrinology

## 2021-10-02 DIAGNOSIS — E119 Type 2 diabetes mellitus without complications: Secondary | ICD-10-CM

## 2021-10-26 ENCOUNTER — Other Ambulatory Visit: Payer: Self-pay | Admitting: Endocrinology

## 2021-10-26 ENCOUNTER — Other Ambulatory Visit: Payer: Self-pay | Admitting: Family Medicine

## 2021-10-26 DIAGNOSIS — E119 Type 2 diabetes mellitus without complications: Secondary | ICD-10-CM

## 2021-10-30 ENCOUNTER — Other Ambulatory Visit: Payer: Self-pay | Admitting: Family Medicine

## 2021-11-01 ENCOUNTER — Other Ambulatory Visit: Payer: Self-pay | Admitting: Family Medicine

## 2021-11-07 ENCOUNTER — Other Ambulatory Visit: Payer: Self-pay | Admitting: Family Medicine

## 2021-11-19 DIAGNOSIS — J069 Acute upper respiratory infection, unspecified: Secondary | ICD-10-CM | POA: Diagnosis not present

## 2021-11-20 DIAGNOSIS — U071 COVID-19: Secondary | ICD-10-CM | POA: Diagnosis not present

## 2021-11-20 DIAGNOSIS — Z6826 Body mass index (BMI) 26.0-26.9, adult: Secondary | ICD-10-CM | POA: Diagnosis not present

## 2021-11-28 ENCOUNTER — Telehealth: Payer: Self-pay | Admitting: Endocrinology

## 2021-11-28 NOTE — Telephone Encounter (Signed)
MEDICATION: atorvastatin (LIPITOR) 40 MG tablet  PHARMACY:   [facility] [place], [place] [address] Phone:  [phone]  Fax:  [phone]      HAS THE PATIENT CONTACTED THEIR PHARMACY?  Yes-Requires new RX  IS THIS A 90 DAY SUPPLY : Yes  IS PATIENT OUT OF MEDICATION: Yes-for 3 weeks  IF NOT; HOW MUCH IS LEFT: 0  LAST APPOINTMENT DATE: @12 /29/2022  NEXT APPOINTMENT DATE:@5 /11/2021  DO WE HAVE YOUR PERMISSION TO LEAVE A DETAILED MESSAGE?: Yes  OTHER COMMENTS:    **Let patient know to contact pharmacy at the end of the day to make sure medication is ready. **  ** Please notify patient to allow 48-72 hours to process**  **Encourage patient to contact the pharmacy for refills or they can request refills through [facility]**

## 2021-11-29 NOTE — Telephone Encounter (Signed)
Message has been sent to pt to F/U with his PCP for refill of Lipitor

## 2021-12-14 ENCOUNTER — Other Ambulatory Visit: Payer: Self-pay | Admitting: Nurse Practitioner

## 2021-12-16 ENCOUNTER — Other Ambulatory Visit: Payer: Self-pay | Admitting: Nurse Practitioner

## 2021-12-18 ENCOUNTER — Other Ambulatory Visit: Payer: Self-pay | Admitting: Nurse Practitioner

## 2021-12-20 ENCOUNTER — Other Ambulatory Visit: Payer: Self-pay | Admitting: Nurse Practitioner

## 2021-12-21 ENCOUNTER — Encounter: Payer: Self-pay | Admitting: Family Medicine

## 2021-12-21 ENCOUNTER — Ambulatory Visit (INDEPENDENT_AMBULATORY_CARE_PROVIDER_SITE_OTHER): Payer: BC Managed Care – PPO | Admitting: Family Medicine

## 2021-12-21 ENCOUNTER — Other Ambulatory Visit: Payer: Self-pay

## 2021-12-21 DIAGNOSIS — E119 Type 2 diabetes mellitus without complications: Secondary | ICD-10-CM | POA: Diagnosis not present

## 2021-12-21 DIAGNOSIS — E785 Hyperlipidemia, unspecified: Secondary | ICD-10-CM

## 2021-12-21 DIAGNOSIS — I10 Essential (primary) hypertension: Secondary | ICD-10-CM | POA: Diagnosis not present

## 2021-12-21 MED ORDER — ENALAPRIL MALEATE 20 MG PO TABS: ORAL_TABLET | ORAL | 3 refills | Status: DC

## 2021-12-21 MED ORDER — ATORVASTATIN CALCIUM 40 MG PO TABS: ORAL_TABLET | ORAL | 3 refills | Status: DC

## 2021-12-21 NOTE — Assessment & Plan Note (Signed)
Well controlled. Continue current medications  

## 2021-12-21 NOTE — Progress Notes (Signed)
Subjective:  Patient ID: Grant Torres, male    DOB: Mar 03, 1961  Age: 61 y.o. MRN: [id number]  CC: Chief Complaint  Patient presents with   Hypertension   Hyperlipidemia    HPI:  61 year old male with type 2 diabetes, multinodular goiter, hypothyroidism, hyperlipidemia presents for follow-up.  Patient states that he is doing well.  No chest pain or shortness of breath.    Type 2 diabetes A1c has been at goal.  Follows with endocrinology.  Glipizide has been decreased to 10 mg once daily.  He is also on metformin 1000 mg twice daily, and Jardiance 25 mg daily. No hypoglycemia.  He states that his blood sugars are in the low 100s.  Hypertension BP fairly well controlled.  Blood pressure currently 135/79.  He is compliant with enalapril 20 mg daily.  Needs refill.  Hyperlipidemia Has been well controlled.  Most recent LDL of 66.  Compliant with Lipitor.  Takes 20 mg daily.  Needs refill.  Patient Active Problem List   Diagnosis Date Noted   Essential hypertension 12/21/2021   Hypothyroidism 08/18/2019   Multinodular goiter 08/11/2018   Hyperlipidemia 09/01/2013   Diabetes ([place]) 09/01/2013    Social Hx   Social History   Socioeconomic History   Marital status: Married    Spouse name: Not on file   Number of children: Not on file   Years of education: Not on file   Highest education level: Not on file  Occupational History   Not on file  Tobacco Use   Smoking status: Never   Smokeless tobacco: Never  Vaping Use   Vaping Use: Never used  Substance and Sexual Activity   Alcohol use: Not on file    Comment: occasionally   Drug use: Never   Sexual activity: Not on file  Other Topics Concern   Not on file  Social History Narrative   Not on file   Social Determinants of Health   Financial Resource Strain: Not on file  Food Insecurity: Not on file  Transportation Needs: Not on file  Physical Activity: Not on file  Stress: Not on file  Social Connections: Not on  file    Review of Systems Per HPI  Objective:  BP 135/79    Pulse 91    Temp 98.6 F (37 C)    Ht 5\' 10"  (1.778 m)    Wt 176 lb (79.8 kg)    SpO2 99%    BMI 25.25 kg/m   BP/Weight 12/21/2021 40/98/1191 02/02/8294  Systolic BP 621 308 657  Diastolic BP 79 72 74  Wt. (Lbs) 176 183.8 178  BMI 25.25 26.37 25.54    Physical Exam Vitals and nursing note reviewed.  Constitutional:      General: He is not in acute distress.    Appearance: Normal appearance. He is not ill-appearing.  Eyes:     General:        Right eye: No discharge.        Left eye: No discharge.     Conjunctiva/sclera: Conjunctivae normal.  Cardiovascular:     Rate and Rhythm: Normal rate and regular rhythm.  Pulmonary:     Effort: Pulmonary effort is normal.     Breath sounds: Normal breath sounds. No wheezing, rhonchi or rales.  Abdominal:     General: There is no distension.     Palpations: Abdomen is soft.     Tenderness: There is no abdominal tenderness.  Neurological:     Mental  Status: He is alert.  Psychiatric:        Mood and Affect: Mood normal.        Behavior: Behavior normal.    Lab Results  Component Value Date   WBC 4.9 01/30/2021   HGB 15.5 01/30/2021   HCT 45.8 01/30/2021   PLT 193 01/30/2021   GLUCOSE 141 (H) 01/30/2021   CHOL 118 01/30/2021   TRIG 48 01/30/2021   HDL 41 01/30/2021   LDLCALC 66 01/30/2021   ALT 18 01/30/2021   AST 18 01/30/2021   NA 144 01/30/2021   K 4.5 01/30/2021   CL 103 01/30/2021   CREATININE 0.71 (L) 01/30/2021   BUN 15 01/30/2021   CO2 24 01/30/2021   TSH 2.22 08/16/2021   PSA 2.23 10/20/2014   HGBA1C 6.8 (H) 08/16/2021   MICROALBUR 3.6 (H) 10/20/2014     Assessment & Plan:   Problem List Items Addressed This Visit       Cardiovascular and Mediastinum   Essential hypertension    Fairly well-controlled.  Continue enalapril.      Relevant Medications   atorvastatin (LIPITOR) 40 MG tablet   enalapril (VASOTEC) 20 MG tablet     Endocrine    Diabetes ([place])    Well-controlled.  Continue current medications.      Relevant Medications   atorvastatin (LIPITOR) 40 MG tablet   enalapril (VASOTEC) 20 MG tablet     Other   Hyperlipidemia    Well-controlled.  Continue Lipitor.      Relevant Medications   atorvastatin (LIPITOR) 40 MG tablet   enalapril (VASOTEC) 20 MG tablet    Meds ordered this encounter  Medications   atorvastatin (LIPITOR) 40 MG tablet    Sig: Take 1/2 tab p.o. daily.    Dispense:  45 tablet    Refill:  3   enalapril (VASOTEC) 20 MG tablet    Sig: Take 1 tablet by mouth once daily APPT IS REQUIRED FOR FUTURE REFILLS    Dispense:  90 tablet    Refill:  3    Follow-up:  Return in about 6 months (around 06/20/2022).  [place]

## 2021-12-21 NOTE — Assessment & Plan Note (Signed)
Well-controlled.  Continue Lipitor. 

## 2021-12-21 NOTE — Assessment & Plan Note (Signed)
Fairly well-controlled.  Continue enalapril.

## 2021-12-21 NOTE — Patient Instructions (Signed)
Continue your current medications.  Follow up in 6 months.   Take care  Dr. Naeema Patlan  

## 2022-01-01 ENCOUNTER — Other Ambulatory Visit: Payer: Self-pay | Admitting: Endocrinology

## 2022-01-10 DIAGNOSIS — Z0000 Encounter for general adult medical examination without abnormal findings: Secondary | ICD-10-CM | POA: Diagnosis not present

## 2022-01-10 DIAGNOSIS — K219 Gastro-esophageal reflux disease without esophagitis: Secondary | ICD-10-CM | POA: Diagnosis not present

## 2022-02-19 ENCOUNTER — Other Ambulatory Visit: Payer: Self-pay | Admitting: Endocrinology

## 2022-02-19 DIAGNOSIS — E119 Type 2 diabetes mellitus without complications: Secondary | ICD-10-CM

## 2022-02-27 ENCOUNTER — Telehealth: Payer: Self-pay | Admitting: Pharmacy Technician

## 2022-02-27 ENCOUNTER — Other Ambulatory Visit (HOSPITAL_COMMUNITY): Payer: Self-pay

## 2022-02-27 ENCOUNTER — Encounter: Payer: Self-pay | Admitting: Endocrinology

## 2022-02-27 ENCOUNTER — Ambulatory Visit (INDEPENDENT_AMBULATORY_CARE_PROVIDER_SITE_OTHER): Payer: BC Managed Care – PPO | Admitting: Endocrinology

## 2022-02-27 VITALS — BP 126/74 | HR 85 | Ht 70.0 in | Wt 180.0 lb

## 2022-02-27 DIAGNOSIS — E119 Type 2 diabetes mellitus without complications: Secondary | ICD-10-CM | POA: Diagnosis not present

## 2022-02-27 DIAGNOSIS — E042 Nontoxic multinodular goiter: Secondary | ICD-10-CM

## 2022-02-27 MED ORDER — RYBELSUS 3 MG PO TABS: 3.0000 mg | ORAL_TABLET | Freq: Every day | ORAL | 11 refills | Status: DC

## 2022-02-27 MED ORDER — GLIPIZIDE 5 MG PO TABS: 5.0000 mg | ORAL_TABLET | Freq: Every day | ORAL | 1 refills | Status: DC

## 2022-02-27 MED ORDER — METFORMIN HCL ER 500 MG PO TB24: 2000.0000 mg | ORAL_TABLET | Freq: Every day | ORAL | 1 refills | Status: DC

## 2022-02-27 NOTE — Progress Notes (Signed)
? ?Subjective:  ? ? Patient ID: Grant Torres, male    DOB: [birth year], 61 y.o.   MRN: [id number] ? ?HPI ?Pt returns for f/u of MNG/hypothyroidism (dx'ed 2017; he has been on synthroid, also since 2017; bx then showed beth cat 2;  [place] in 2019 and 2020 showed no change).  pt states he feels well in general.  He does not notice the goiter.   ?He also requests to f/u DM here (dx'ed 2229; no known complications; he takes 3 oral meds; he has never been on insulin).  He again says cbg's are in the low-100's.   ?Past Medical History:  ?Diagnosis Date  ? Diabetes mellitus without complication ([place])   ? Hyperlipidemia   ? Hypertension   ? Microproteinuria   ? ? ?Past Surgical History:  ?Procedure Laterality Date  ? CERVICAL SPINE SURGERY  11/25/2015  ? COLONOSCOPY N/A 04/10/2018  ? Procedure: COLONOSCOPY;  Surgeon: Rogene Houston, MD;  [facility];  Service: Endoscopy;  Laterality: N/A;  200  ? POLYPECTOMY  04/10/2018  ? Procedure: POLYPECTOMY;  Surgeon: Rogene Houston, MD;  [facility];  Service: Endoscopy;;  colon  ? VASECTOMY    ? WISDOM TOOTH EXTRACTION    ? ? ?Social History  ? ?Socioeconomic History  ? Marital status: Married  ?  Spouse name: Not on file  ? Number of children: Not on file  ? Years of education: Not on file  ? Highest education level: Not on file  ?Occupational History  ? Not on file  ?Tobacco Use  ? Smoking status: Never  ? Smokeless tobacco: Never  ?Vaping Use  ? Vaping Use: Never used  ?Substance and Sexual Activity  ? Alcohol use: Not on file  ?  Comment: occasionally  ? Drug use: Never  ? Sexual activity: Not on file  ?Other Topics Concern  ? Not on file  ?Social History Narrative  ? Not on file  ? ?Social Determinants of Health  ? ?Financial Resource Strain: Not on file  ?Food Insecurity: Not on file  ?Transportation Needs: Not on file  ?Physical Activity: Not on file  ?Stress: Not on file  ?Social Connections: Not on file  ?Intimate Partner Violence: Not on file   ? ? ?Current Outpatient Medications on File Prior to Visit  ?Medication Sig Dispense Refill  ? aspirin EC 81 MG tablet Take 81 mg by mouth daily.    ? atorvastatin (LIPITOR) 40 MG tablet Take 1/2 tab p.o. daily. 45 tablet 3  ? blood glucose meter kit and supplies KIT Dispense based on patient and insurance preference. Use to test blood sugar once daily. ICD 10 code E11.9 1 each 5  ? Blood Glucose Monitoring Suppl (ONETOUCH VERIO) w/Device KIT TEST ONCE DAILY 1 kit 0  ? enalapril (VASOTEC) 20 MG tablet Take 1 tablet by mouth once daily APPT IS REQUIRED FOR FUTURE REFILLS 90 tablet 3  ? glucose blood test strip Test blood sugar once daily 100 each 5  ? JARDIANCE 25 MG TABS tablet Take 1 tablet by mouth once daily 90 tablet 3  ? levothyroxine (SYNTHROID) 75 MCG tablet TAKE 1 TABLET BY MOUTH BEFORE BREAKFAST 90 tablet 0  ? OneTouch Delica Lancets 79G MISC USE TO TEST BLOOD SUGAR ONCE A DAY 100 each 0  ? ?No current facility-administered medications on file prior to visit.  ? ? ?No Known Allergies ? ?Family History  ?Problem Relation Age of Onset  ? Hypertension Sister   ?  Colon cancer Neg Hx   ? Thyroid disease Neg Hx   ? ? ?BP 126/74 (BP Location: Left Arm, Patient Position: Sitting, Cuff Size: Normal)   Pulse 85   Ht _0  (1.778 m)   Wt 180 lb (81.6 kg)   SpO2 98%   BMI 25.83 kg/m?  ? ? ?Review of Systems ?He denies hypoglycemia.  ?   ?Objective:  ? Physical Exam ?VITAL SIGNS:  See vs page ?GENERAL: no distress ? ?Lab Results  ?Component Value Date  ? CREATININE 0.71 (L) 01/30/2021  ? BUN 15 01/30/2021  ? NA 144 01/30/2021  ? K 4.5 01/30/2021  ? CL 103 01/30/2021  ? CO2 24 01/30/2021  ? ? ? ?   ?Assessment & Plan:  ?MNG: due for recheck this year.   ?Type 2 DM: uncontrolled.  We'll favor GLP rx.   ? ? ?Patient Instructions  ?I have sent a prescription to your pharmacy, to add Rybelsus.   ?Please also reduce the glipizide to 1 pill per day. ?Please continue the same other medications.   ?Let's recheck the  ultrasound.  you will receive a phone call, about a day and time for an appointment.   ?You should have an endocrinology follow-up appointment in 3 months.   ? ? ?

## 2022-02-27 NOTE — Telephone Encounter (Signed)
Patient Advocate Encounter ? ?Received notification from [facility] that prior authorization for RYBELSUS '3MG'$  is required. ?  ?PA submitted on 5.2.23 ?Key [id number] ?Status is pending ?  ? Clinic will continue to follow ? ?Damon Baisch R Azra Abrell, CPhT ?Patient Advocate ?[facility] ?Phone: [phone] ?Fax:  [phone] ? ?

## 2022-02-27 NOTE — Patient Instructions (Addendum)
I have sent a prescription to your pharmacy, to add Rybelsus.   ?Please also reduce the glipizide to 1 pill per day. ?Please continue the same other medications.   ?Let's recheck the ultrasound.  you will receive a phone call, about a day and time for an appointment.   ?You should have an endocrinology follow-up appointment in 3 months.   ?

## 2022-03-05 ENCOUNTER — Other Ambulatory Visit (HOSPITAL_COMMUNITY): Payer: Self-pay

## 2022-03-06 ENCOUNTER — Other Ambulatory Visit (HOSPITAL_COMMUNITY): Payer: Self-pay

## 2022-03-06 NOTE — Telephone Encounter (Signed)
Patient Advocate Encounter ? ?Patient Advocate Encounter ? ?Prior Authorization for Rybelsys '3mg'$  has been approved.  However the quantity of 30 tabs for 30 days was denied. The pt can get 30 tabs per 180 days, due to the ins requirement to increase dose every 30 days up to a max dose of '14mg'$ . '7mg'$  & '14mg'$  have been approved through 02/27/2023 ? ?Effective dates: 02/27/22 through 02/27/23 ? ?Per Test Claim Patients co-pay is: unable to complete test claim due to day supply issue.   ? ?Spoke with Pharmacy to Process. They are unable to process. Still says it needs a PA. I believe it's due to the day supply issue. ?  ?This determination is currently being sent for Provider Courtesy Review.  Office notes were faxed to [phone].  ? ?This encounter will continue to be updated until final determination.   ? ? ?Specialty Pharmacy Patient Advocate ?Fax:  [phone] ? ? ?

## 2022-03-09 ENCOUNTER — Other Ambulatory Visit (HOSPITAL_COMMUNITY): Payer: Self-pay

## 2022-03-15 ENCOUNTER — Other Ambulatory Visit (HOSPITAL_COMMUNITY): Payer: Self-pay

## 2022-03-15 NOTE — Telephone Encounter (Signed)
Patient Advocate Encounter  2 test claims had gotten stuck on the ins end, which made them think we were trying to fill more than a 30 day supply. Ins rep was able to reverse them and I was able to get a paid test claim.   Spoke with Pharmacy to Process.  Patient Advocate Fax:  [phone]

## 2022-05-30 ENCOUNTER — Encounter: Payer: Self-pay | Admitting: "Endocrinology

## 2022-05-30 ENCOUNTER — Ambulatory Visit (INDEPENDENT_AMBULATORY_CARE_PROVIDER_SITE_OTHER): Payer: BC Managed Care – PPO | Admitting: "Endocrinology

## 2022-05-30 VITALS — BP 112/66 | HR 72 | Ht 70.0 in | Wt 175.6 lb

## 2022-05-30 DIAGNOSIS — E119 Type 2 diabetes mellitus without complications: Secondary | ICD-10-CM | POA: Diagnosis not present

## 2022-05-30 DIAGNOSIS — E038 Other specified hypothyroidism: Secondary | ICD-10-CM | POA: Diagnosis not present

## 2022-05-30 DIAGNOSIS — E782 Mixed hyperlipidemia: Secondary | ICD-10-CM

## 2022-05-30 DIAGNOSIS — I10 Essential (primary) hypertension: Secondary | ICD-10-CM

## 2022-05-30 DIAGNOSIS — E042 Nontoxic multinodular goiter: Secondary | ICD-10-CM

## 2022-05-30 LAB — POCT GLYCOSYLATED HEMOGLOBIN (HGB A1C): HbA1c, POC (controlled diabetic range): 7.3 % — AB (ref 0.0–7.0)

## 2022-05-30 MED ORDER — METFORMIN HCL ER 500 MG PO TB24: 500.0000 mg | ORAL_TABLET | Freq: Two times a day (BID) | ORAL | 1 refills | Status: DC

## 2022-05-30 MED ORDER — ENALAPRIL MALEATE 20 MG PO TABS
ORAL_TABLET | ORAL | 1 refills | Status: DC
Start: 2022-05-30 — End: 2022-12-26

## 2022-05-30 MED ORDER — EMPAGLIFLOZIN 25 MG PO TABS: 25.0000 mg | ORAL_TABLET | Freq: Every day | ORAL | 1 refills | Status: DC

## 2022-05-30 MED ORDER — ATORVASTATIN CALCIUM 40 MG PO TABS: 40.0000 mg | ORAL_TABLET | Freq: Every day | ORAL | 1 refills | Status: DC

## 2022-05-30 NOTE — Patient Instructions (Signed)
                                     Advice for Weight Management  -For most of us the best way to lose weight is by diet management. Generally speaking, diet management means consuming less calories intentionally which over time brings about progressive weight loss.  This can be achieved more effectively by avoiding ultra processed carbohydrates, processed meats, unhealthy fats.    It is critically important to know your numbers: how much calorie you are consuming and how much calorie you need. More importantly, our carbohydrates sources should be unprocessed naturally occurring  complex starch food items.  It is always important to balance nutrition also by  appropriate intake of proteins (mainly plant-based), healthy fats/oils, plenty of fruits and vegetables.   -The American College of Lifestyle Medicine (ACL M) recommends nutrition derived mostly from Whole Food, Plant Predominant Sources example an apple instead of applesauce or apple pie. Eat Plenty of vegetables, Mushrooms, fruits, Legumes, Whole Grains, Nuts, seeds in lieu of processed meats, processed snacks/pastries red meat, poultry, eggs.  Use only water or unsweetened tea for hydration.  The College also recommends the need to stay away from risky substances including alcohol, smoking; obtaining 7-9 hours of restorative sleep, at least 150 minutes of moderate intensity exercise weekly, importance of healthy social connections, and being mindful of stress and seek help when it is overwhelming.    -Sticking to a routine mealtime to eat 3 meals a day and avoiding unnecessary snacks is shown to have a big role in weight control. Under normal circumstances, the only time we burn stored energy is when we are hungry, so allow  some hunger to take place- hunger means no food between appropriate meal times, only water.  It is not advisable to starve.   -It is better to avoid simple carbohydrates including:  Cakes, Sweet Desserts, Ice Cream, Soda (diet and regular), Sweet Tea, Candies, Chips, Cookies, Store Bought Juices, Alcohol in Excess of  1-2 drinks a day, Lemonade,  Artificial Sweeteners, Doughnuts, Coffee Creamers, "Sugar-free" Products, etc, etc.  This is not a complete list.....    -Consulting with certified diabetes educators is proven to provide you with the most accurate and current information on diet.  Also, you may be  interested in discussing diet options/exchanges , we can schedule a visit with Grant Torres, RDN, CDE for individualized nutrition education.  -Exercise: If you are able: 30 -60 minutes a day ,4 days a week, or 150 minutes of moderate intensity exercise weekly.    The longer the better if tolerated.  Combine stretch, strength, and aerobic activities.  If you were told in the past that you have high risk for cardiovascular diseases, or if you are currently symptomatic, you may seek evaluation by your heart doctor prior to initiating moderate to intense exercise programs.                                  Additional Care Considerations for Diabetes/Prediabetes   -Diabetes  is a chronic disease.  The most important care consideration is regular follow-up with your diabetes care provider with the goal being avoiding or delaying its complications and to take advantage of advances in medications and technology.  If appropriate actions are taken early enough, type 2 diabetes can even be   reversed.  Seek information from the right source.  - Whole Food, Plant Predominant Nutrition is highly recommended: Eat Plenty of vegetables, Mushrooms, fruits, Legumes, Whole Grains, Nuts, seeds in lieu of processed meats, processed snacks/pastries red meat, poultry, eggs as recommended by American College of  Lifestyle Medicine (ACLM).  -Type 2 diabetes is known to coexist with other important comorbidities such as high blood pressure and high cholesterol.  It is critical to control not only the  diabetes but also the high blood pressure and high cholesterol to minimize and delay the risk of complications including coronary artery disease, stroke, amputations, blindness, etc.  The good news is that this diet recommendation for type 2 diabetes is also very helpful for managing high cholesterol and high blood blood pressure.  - Studies showed that people with diabetes will benefit from a class of medications known as ACE inhibitors and statins.  Unless there are specific reasons not to be on these medications, the standard of care is to consider getting one from these groups of medications at an optimal doses.  These medications are generally considered safe and proven to help protect the heart and the kidneys.    - People with diabetes are encouraged to initiate and maintain regular follow-up with eye doctors, foot doctors, dentists , and if necessary heart and kidney doctors.     - It is highly recommended that people with diabetes quit smoking or stay away from smoking, and get yearly  flu vaccine and pneumonia vaccine at least every 5 years.  See above for additional recommendations on exercise, sleep, stress management , and healthy social connections.      

## 2022-05-30 NOTE — Progress Notes (Signed)
Endocrinology Consult Note       05/30/2022, 10:31 AM   Subjective:    Patient ID: Grant Torres, male    DOB: 03-21-1961.  Grant Torres is being seen in consultation for management of currently uncontrolled symptomatic diabetes requested by  Coral Spikes, DO.   Past Medical History:  Diagnosis Date   Diabetes mellitus without complication ([place])    Hyperlipidemia    Hypertension    Microproteinuria     Past Surgical History:  Procedure Laterality Date   CERVICAL SPINE SURGERY  11/25/2015   COLONOSCOPY N/A 04/10/2018   Procedure: COLONOSCOPY;  Surgeon: Rogene Houston, MD;  [facility];  Service: Endoscopy;  Laterality: N/A;  200   POLYPECTOMY  04/10/2018   Procedure: POLYPECTOMY;  Surgeon: Rogene Houston, MD;  Location: [facility];  Service: Endoscopy;;  colon   VASECTOMY     WISDOM TOOTH EXTRACTION      Social History   Socioeconomic History   Marital status: Married    Spouse name: Not on file   Number of children: Not on file   Years of education: Not on file   Highest education level: Not on file  Occupational History   Not on file  Tobacco Use   Smoking status: Never   Smokeless tobacco: Never  Vaping Use   Vaping Use: Never used  Substance and Sexual Activity   Alcohol use: Not on file    Comment: occasionally   Drug use: Never   Sexual activity: Not on file  Other Topics Concern   Not on file  Social History Narrative   Not on file   Social Determinants of Health   Financial Resource Strain: Not on file  Food Insecurity: Not on file  Transportation Needs: Not on file  Physical Activity: Not on file  Stress: Not on file  Social Connections: Not on file    Family History  Problem Relation Age of Onset   Kidney disease Mother    Diabetes Mother    Heart attack Mother    Stroke Mother    Heart failure Mother    Heart attack Father    Hypertension  Sister    Colon cancer Neg Hx    Thyroid disease Neg Hx     Outpatient Encounter Medications as of 05/30/2022  Medication Sig   levothyroxine (SYNTHROID) 50 MCG tablet Take 50 mcg by mouth daily before breakfast.   aspirin EC 81 MG tablet Take 81 mg by mouth daily.   atorvastatin (LIPITOR) 40 MG tablet Take 1 tablet (40 mg total) by mouth at bedtime. Take 1/2 tab p.o. daily.   blood glucose meter kit and supplies KIT Dispense based on patient and insurance preference. Use to test blood sugar once daily. ICD 10 code E11.9   Blood Glucose Monitoring Suppl (ONETOUCH VERIO) w/Device KIT TEST ONCE DAILY   empagliflozin (JARDIANCE) 25 MG TABS tablet Take 1 tablet (25 mg total) by mouth daily.   enalapril (VASOTEC) 20 MG tablet Take 1 tablet by mouth once daily APPT IS REQUIRED FOR FUTURE REFILLS   glucose blood test strip  Test blood sugar once daily   metFORMIN (GLUCOPHAGE-XR) 500 MG 24 hr tablet Take 1 tablet (500 mg total) by mouth 2 (two) times daily after a meal.   OneTouch Delica Lancets 38G MISC USE TO TEST BLOOD SUGAR ONCE A DAY   Semaglutide (RYBELSUS) 3 MG TABS Take 3 mg by mouth daily.   [DISCONTINUED] atorvastatin (LIPITOR) 40 MG tablet Take 1/2 tab p.o. daily.   [DISCONTINUED] enalapril (VASOTEC) 20 MG tablet Take 1 tablet by mouth once daily APPT IS REQUIRED FOR FUTURE REFILLS   [DISCONTINUED] glipiZIDE (GLUCOTROL) 5 MG tablet Take 1 tablet (5 mg total) by mouth daily before breakfast.   [DISCONTINUED] JARDIANCE 25 MG TABS tablet Take 1 tablet by mouth once daily   [DISCONTINUED] levothyroxine (SYNTHROID) 75 MCG tablet TAKE 1 TABLET BY MOUTH BEFORE BREAKFAST   [DISCONTINUED] metFORMIN (GLUCOPHAGE-XR) 500 MG 24 hr tablet Take 4 tablets (2,000 mg total) by mouth daily.   No facility-administered encounter medications on file as of 05/30/2022.    ALLERGIES: No Known Allergies  VACCINATION STATUS: Immunization History  Administered Date(s) Administered   Influenza, Quadrivalent,  Recombinant, Inj, Pf 08/04/2019   Influenza,inj,Quad PF,6+ Mos 10/26/2015, 08/15/2018   Influenza-Unspecified 08/05/2013, 07/26/2016, 07/22/2020, 10/10/2021   PFIZER(Purple Top)SARS-COV-2 Vaccination 01/03/2020, 01/24/2020   Pneumococcal-Unspecified 11/13/2002   Tdap 11/26/2018    Diabetes He presents for his initial diabetic visit. He has type 2 diabetes mellitus. Onset time: He was diagnosed at approximate age of 55 years. His disease course has been fluctuating. There are no hypoglycemic associated symptoms. Pertinent negatives for hypoglycemia include no confusion, headaches, pallor or seizures. There are no diabetic associated symptoms. Pertinent negatives for diabetes include no chest pain, no fatigue, no polydipsia, no polyphagia, no polyuria and no weakness. There are no hypoglycemic complications. Symptoms are stable. There are no diabetic complications. Risk factors for coronary artery disease include diabetes mellitus, dyslipidemia, hypertension, male sex and family history. Current diabetic treatments: He is currently on glipizide 5 mg once a day, metformin ER 2000 daily, Jardiance 25 mg once a day, Rybelsus 3 mg once a day. His weight is fluctuating minimally (Patient reports that historically he weighed as high as 211 pounds, currently 175 pounds.). He is following a generally unhealthy diet. When asked about meal planning, he reported none. He has not had a previous visit with a dietitian. He participates in exercise intermittently. (He brought a meter which has no readings in the last 90 days.  His point-of-care A1c is 7.3%.) An ACE inhibitor/angiotensin II receptor blocker is being taken.  Hyperlipidemia This is a chronic problem. The current episode started more than 1 year ago. The problem is controlled. Exacerbating diseases include diabetes. Pertinent negatives include no chest pain, myalgias or shortness of breath. Risk factors for coronary artery disease include diabetes mellitus,  dyslipidemia, family history, hypertension and male sex.  Hypertension This is a chronic problem. The current episode started more than 1 year ago. Pertinent negatives include no chest pain, headaches, neck pain, palpitations or shortness of breath. Risk factors for coronary artery disease include dyslipidemia, diabetes mellitus and family history. Past treatments include ACE inhibitors. Identifiable causes of hypertension include a thyroid problem.  Thyroid Problem Presents for initial visit. Onset time: Patient is known to have multinodular goiter between 2017-2019 and thyroid sonograms.  He also has multiple nodules in his thyroid.  1 nodule was biopsied with benign outcomes. Patient reports no constipation, diarrhea, fatigue or palpitations. Past treatments include levothyroxine. Prior procedures include thyroid FNA. The following  procedures have not been performed: radioiodine uptake scan and thyroidectomy. His past medical history is significant for diabetes and hyperlipidemia.     Review of Systems  Constitutional:  Negative for chills, fatigue, fever and unexpected weight change.  HENT:  Negative for dental problem, mouth sores and trouble swallowing.   Eyes:  Negative for visual disturbance.  Respiratory:  Negative for cough, choking, chest tightness, shortness of breath and wheezing.   Cardiovascular:  Negative for chest pain, palpitations and leg swelling.  Gastrointestinal:  Negative for abdominal distention, abdominal pain, constipation, diarrhea, nausea and vomiting.  Endocrine: Negative for polydipsia, polyphagia and polyuria.  Genitourinary:  Negative for dysuria, flank pain, hematuria and urgency.  Musculoskeletal:  Negative for back pain, gait problem, myalgias and neck pain.  Skin:  Negative for pallor, rash and wound.  Neurological:  Negative for seizures, syncope, weakness, numbness and headaches.  Psychiatric/Behavioral:  Negative for confusion and dysphoric mood.      Objective:       05/30/2022    8:26 AM 02/27/2022    7:41 AM 12/21/2021    9:58 AM  Vitals with BMI  Height 5' 10"  5' 10"  5' 10"   Weight 175 lbs 10 oz 180 lbs 176 lbs  BMI 25.2 75.91 63.84  Systolic 665 993 570  Diastolic 66 74 79  Pulse 72 85 91    BP 112/66   Pulse 72   Ht 5' 10"  (1.778 m)   Wt 175 lb 9.6 oz (79.7 kg)   BMI 25.20 kg/m   Wt Readings from Last 3 Encounters:  05/30/22 175 lb 9.6 oz (79.7 kg)  02/27/22 180 lb (81.6 kg)  12/21/21 176 lb (79.8 kg)     Physical Exam Constitutional:      General: He is not in acute distress.    Appearance: He is well-developed.  HENT:     Head: Normocephalic and atraumatic.  Neck:     Thyroid: No thyromegaly.     Trachea: No tracheal deviation.  Cardiovascular:     Rate and Rhythm: Normal rate.     Pulses:          Dorsalis pedis pulses are 1+ on the right side and 1+ on the left side.       Posterior tibial pulses are 1+ on the right side and 1+ on the left side.     Heart sounds: Normal heart sounds, S1 normal and S2 normal. No murmur heard.    No gallop.  Pulmonary:     Effort: No respiratory distress.     Breath sounds: Normal breath sounds. No wheezing.  Abdominal:     General: Bowel sounds are normal. There is no distension.     Palpations: Abdomen is soft.     Tenderness: There is no abdominal tenderness. There is no guarding.  Musculoskeletal:     Right shoulder: No swelling or deformity.     Cervical back: Normal range of motion and neck supple.  Skin:    General: Skin is warm and dry.     Findings: No rash.     Nails: There is no clubbing.  Neurological:     Mental Status: He is alert and oriented to person, place, and time.     Cranial Nerves: No cranial nerve deficit.     Sensory: No sensory deficit.     Gait: Gait normal.     Deep Tendon Reflexes: Reflexes are normal and symmetric.  Psychiatric:  Speech: Speech normal.        Behavior: Behavior normal. Behavior is cooperative.         Thought Content: Thought content normal.        Judgment: Judgment normal.       CMP ( most recent) CMP     Component Value Date/Time   NA 144 01/30/2021 0910   K 4.5 01/30/2021 0910   CL 103 01/30/2021 0910   CO2 24 01/30/2021 0910   GLUCOSE 141 (H) 01/30/2021 0910   GLUCOSE 224 (H) 10/20/2014 0937   BUN 15 01/30/2021 0910   CREATININE 0.71 (L) 01/30/2021 0910   CREATININE 0.77 10/20/2014 0937   CALCIUM 9.4 01/30/2021 0910   PROT 7.1 01/30/2021 0910   ALBUMIN 4.7 01/30/2021 0910   AST 18 01/30/2021 0910   ALT 18 01/30/2021 0910   ALKPHOS 54 01/30/2021 0910   BILITOT 0.5 01/30/2021 0910   GFRNONAA 103 08/01/2020 0904   GFRAA 120 08/01/2020 0904     Diabetic Labs (most recent): Lab Results  Component Value Date   HGBA1C 7.3 (A) 05/30/2022   HGBA1C 6.8 (H) 08/16/2021   HGBA1C 7.0 (H) 01/30/2021   MICROALBUR 3.6 (H) 10/20/2014   MICROALBUR 1.99 (H) 09/01/2013     Lipid Panel ( most recent) Lipid Panel     Component Value Date/Time   CHOL 118 01/30/2021 0910   TRIG 48 01/30/2021 0910   HDL 41 01/30/2021 0910   CHOLHDL 2.9 01/30/2021 0910   CHOLHDL 2.9 10/20/2014 0937   VLDL 20 10/20/2014 0937   LDLCALC 66 01/30/2021 0910   LABVLDL 11 01/30/2021 0910      Lab Results  Component Value Date   TSH 2.22 08/16/2021   TSH 2.66 08/16/2020   TSH 2.710 09/18/2019   TSH 2.76 08/11/2018   TSH 3.880 01/13/2016   FREET4 1.10 08/16/2021   FREET4 1.14 08/16/2020   FREET4 1.65 09/18/2019   FREET4 1.05 08/11/2018           Assessment & Plan:   1. Type 2 diabetes mellitus without complication, without long-term current use of insulin ([place])  2. Mixed hyperlipidemia  3. Essential hypertension, benign  4. Other specified hypothyroidism  5. Multinodular goiter   - Grant Torres has currently uncontrolled symptomatic type 2 DM since  61 years of age,  with most recent A1c of 7.3 %. Recent labs reviewed. - I had a long discussion with him about the  progressive nature of diabetes and the pathology behind its complications. -He does not report any gross complications from his diabetes, however he has comorbid conditions of hyperlipidemia, hypertension, and patient remains at a high risk for more acute and chronic complications which include CAD, CVA, CKD, retinopathy, and neuropathy. These are all discussed in detail with him.  - I discussed all available options of managing his diabetes including de-escalation of medications. I have counseled him on diet  and weight management  by adopting a Whole Food , Plant Predominant  ( WFPP) nutrition as recommended by [employer] of Lifestyle Medicine. Patient is encouraged to switch to  unprocessed or minimally processed  complex starch, adequate protein intake (mainly plant source), minimal liquid fat ( mainly vegetable oils), plenty of fruits, and vegetables. -  he is advised to stick to a routine mealtimes to eat 3 complete meals a day and snack only when necessary ( to snack only to correct hypoglycemia BG <70 day time or <100 at night).   - he acknowledges  that there is a room for improvement in his food and drink choices. - Further Specific Suggestion is made for him to avoid simple carbohydrates  from his diet including Cakes, Sweet Desserts, Ice Cream, Soda (diet and regular), Sweet Tea, Candies, Chips, Cookies, Store Bought Juices, Alcohol ,  Artificial Sweeteners,  Coffee Creamer, and "Sugar-free" Products. This will help patient to have more stable blood glucose profile and potentially avoid unintended weight gain.  The following Lifestyle Medicine recommendations according to [facility]) were discussed and offered to patient and he agrees to start the journey:  A. Whole Foods, Plant-based plate comprising of fruits and vegetables, plant-based proteins, whole-grain carbohydrates was discussed in detail with the patient.   A list for source of those nutrients  were also provided to the patient.  Patient will use only water or unsweetened tea for hydration. B.  The need to stay away from risky substances including alcohol, smoking; obtaining 7 to 9 hours of restorative sleep, at least 150 minutes of moderate intensity exercise weekly, the importance of healthy social connections,  and stress reduction techniques were discussed. C.  A full color page of  Calorie density of various food groups per pound showing examples of each food groups was provided to the patient.  - he will be scheduled with Jearld Fenton, RDN, CDE for individualized diabetes education.  - I have approached him with the following plan to manage  his diabetes and patient agrees:   -This patient will benefit from de-escalation in his medications.  He is advised to discontinue glipizide and stay off of Rybelsus for now.  He is advised to lower metformin to 500 mg XR p.o. twice daily, continue Jardiance 25 mg once a day.  He is approached to start him monitoring blood glucose twice a day-daily before breakfast and at bedtime and return in 2 weeks with his meter and logs.   - he is encouraged to call clinic for blood glucose levels less than 70 or above 200 mg /dl.  - Specific targets for  A1c;  LDL, HDL,  and Triglycerides were discussed with the patient.  2) Blood Pressure /Hypertension:  his blood pressure is  controlled to target.   he is advised to continue his current medications including enalapril 20 mg once a day with breakfast . 3) Lipids/Hyperlipidemia:   Review of his recent lipid panel showed  controlled  LDL at 66 .  he  is advised to continue    atorvastatin 40 mg daily at bedtime.  Side effects and precautions discussed with him.  4)  Weight/Diet:  Body mass index is 25.2 kg/m.  -    he is not a candidate for weight loss. I discussed with him the fact that loss of 5 - 10% of his  current body weight will have the most impact on his diabetes management.  The above detailed   ACLM recommendations for nutrition, exercise, sleep, social life, avoidance of risky substances, the need for restorative sleep   information will also detailed on discharge instructions.   5) hypothyroidism: No recent thyroid function tests.  He is advised to continue his current dose of levothyroxine 50 mcg p.o. daily before breakfast.  - We discussed about the correct intake of his thyroid hormone, on empty stomach at fasting, with water, separated by at least 30 minutes from breakfast and other medications,  and separated by more than 4 hours from calcium, iron, multivitamins, acid reflux medications (PPIs). -Patient is  made aware of the fact that thyroid hormone replacement is needed for life, dose to be adjusted by periodic monitoring of thyroid function tests.  6) multinodular goiter: Review of his ultrasound between 2017-2019 documented multinodular goiter.  He does have one of his nodules biopsied with benign outcomes.  He will benefit from repeat/surveillance thyroid ultrasound which he will complete before his next visit.   7) Chronic Care/Health Maintenance:  -he  is on ACEI/ARB and Statin medications and  is encouraged to initiate and continue to follow up with Ophthalmology, Dentist,  Podiatrist at least yearly or according to recommendations, and advised to   stay away from smoking. I have recommended yearly flu vaccine and pneumonia vaccine at least every 5 years; moderate intensity exercise for up to 150 minutes weekly; and  sleep for 7- 9 hours a day.  - he is  advised to maintain close follow up with Coral Spikes, DO for primary care needs, as well as his other providers for optimal and coordinated care.   I spent 65 minutes in the care of the patient today including review of labs from [facility], Lipids, Thyroid Function, Hematology (current and previous including abstractions from other facilities); face-to-face time discussing  his blood glucose readings/logs, discussing  hypoglycemia and hyperglycemia episodes and symptoms, medications doses, his options of short and long term treatment based on the latest standards of care / guidelines;  discussion about incorporating lifestyle medicine;  and documenting the encounter. Risk reduction counseling performed per USPSTF guidelines to reduce obesity and cardiovascular risk factors.      Please refer to Patient Instructions for Blood Glucose Monitoring and Insulin/Medications Dosing Guide"  in media tab for additional information. Please  also refer to " Patient Self Inventory" in the Media  tab for reviewed elements of pertinent patient history.  Grant Torres participated in the discussions, expressed understanding, and voiced agreement with the above plans.  All questions were answered to his satisfaction. he is encouraged to contact clinic should he have any questions or concerns prior to his return visit.   Follow up plan: - Return in about 2 weeks (around 06/13/2022) for F/U with Pre-visit Labs, Thyroid / Neck Ultrasound.  Glade Lloyd, MD [facility] [address] [place],  [address] Phone: [phone]  Fax: [phone]    05/30/2022, 10:31 AM  This note was partially dictated with voice recognition software. Similar sounding words can be transcribed inadequately or may not  be corrected upon review.

## 2022-05-31 LAB — VITAMIN B12: Vitamin B-12: 470 pg/mL (ref 232–1245)

## 2022-05-31 LAB — LIPID PANEL
Chol/HDL Ratio: 2.3 ratio (ref 0.0–5.0)
Cholesterol, Total: 107 mg/dL (ref 100–199)
HDL: 46 mg/dL (ref >39)
LDL Chol Calc (NIH): 48 mg/dL (ref 0–99)
Triglycerides: 56 mg/dL (ref 0–149)
VLDL Cholesterol Cal: 13 mg/dL (ref 5–40)

## 2022-05-31 LAB — COMPREHENSIVE METABOLIC PANEL
ALT: 27 IU/L (ref 0–44)
AST: 18 IU/L (ref 0–40)
Albumin/Globulin Ratio: 1.9 (ref 1.2–2.2)
Albumin: 4.7 g/dL (ref 3.8–4.9)
Alkaline Phosphatase: 55 IU/L (ref 44–121)
BUN/Creatinine Ratio: 19 (ref 10–24)
BUN: 13 mg/dL (ref 8–27)
Bilirubin Total: 0.6 mg/dL (ref 0.0–1.2)
CO2: 23 mmol/L (ref 20–29)
Calcium: 9.7 mg/dL (ref 8.6–10.2)
Chloride: 103 mmol/L (ref 96–106)
Creatinine, Ser: 0.69 mg/dL — ABNORMAL LOW (ref 0.76–1.27)
Globulin, Total: 2.5 g/dL (ref 1.5–4.5)
Glucose: 186 mg/dL — ABNORMAL HIGH (ref 70–99)
Potassium: 4.3 mmol/L (ref 3.5–5.2)
Sodium: 141 mmol/L (ref 134–144)
Total Protein: 7.2 g/dL (ref 6.0–8.5)
eGFR: 106 mL/min/{1.73_m2} (ref >59)

## 2022-05-31 LAB — VITAMIN D 25 HYDROXY (VIT D DEFICIENCY, FRACTURES): Vit D, 25-Hydroxy: 21.2 ng/mL — ABNORMAL LOW (ref 30.0–100.0)

## 2022-05-31 LAB — TSH: TSH: 4.13 u[IU]/mL (ref 0.450–4.500)

## 2022-05-31 LAB — T4, FREE: Free T4: 1.39 ng/dL (ref 0.82–1.77)

## 2022-06-04 ENCOUNTER — Telehealth: Payer: Self-pay | Admitting: "Endocrinology

## 2022-06-04 DIAGNOSIS — E038 Other specified hypothyroidism: Secondary | ICD-10-CM

## 2022-06-04 DIAGNOSIS — E119 Type 2 diabetes mellitus without complications: Secondary | ICD-10-CM

## 2022-06-04 MED ORDER — BLOOD GLUCOSE MONITOR KIT: PACK | 0 refills | Status: AC

## 2022-06-04 MED ORDER — LEVOTHYROXINE SODIUM 50 MCG PO TABS: 50.0000 ug | ORAL_TABLET | Freq: Every day | ORAL | 0 refills | Status: DC

## 2022-06-04 NOTE — Telephone Encounter (Signed)
Rxs sent

## 2022-06-04 NOTE — Telephone Encounter (Signed)
Can you send in pt's Levothyroxine RX to [facility]. He also will need a new RX for a meter/supplies. He will cal his insurance and let us know what his insurance pays for

## 2022-06-04 NOTE — Telephone Encounter (Signed)
Pt is asking if it is okay to purchase a watch at [facility] that checks his glucose. Please advise?

## 2022-06-04 NOTE — Telephone Encounter (Signed)
Patient made aware.

## 2022-06-13 ENCOUNTER — Ambulatory Visit (HOSPITAL_COMMUNITY): Payer: BC Managed Care – PPO

## 2022-06-14 ENCOUNTER — Ambulatory Visit (INDEPENDENT_AMBULATORY_CARE_PROVIDER_SITE_OTHER): Payer: BC Managed Care – PPO | Admitting: "Endocrinology

## 2022-06-14 ENCOUNTER — Encounter: Payer: Self-pay | Admitting: "Endocrinology

## 2022-06-14 VITALS — BP 110/64 | HR 84 | Ht 70.0 in | Wt 169.8 lb

## 2022-06-14 DIAGNOSIS — E782 Mixed hyperlipidemia: Secondary | ICD-10-CM | POA: Diagnosis not present

## 2022-06-14 DIAGNOSIS — E119 Type 2 diabetes mellitus without complications: Secondary | ICD-10-CM | POA: Diagnosis not present

## 2022-06-14 DIAGNOSIS — E038 Other specified hypothyroidism: Secondary | ICD-10-CM

## 2022-06-14 DIAGNOSIS — E042 Nontoxic multinodular goiter: Secondary | ICD-10-CM

## 2022-06-14 DIAGNOSIS — I10 Essential (primary) hypertension: Secondary | ICD-10-CM | POA: Diagnosis not present

## 2022-06-14 MED ORDER — EMPAGLIFLOZIN 10 MG PO TABS: 10.0000 mg | ORAL_TABLET | Freq: Every day | ORAL | 1 refills | Status: DC

## 2022-06-14 NOTE — Patient Instructions (Signed)

## 2022-06-14 NOTE — Progress Notes (Signed)
Endocrinology Consult Note       06/14/2022, 6:24 PM   Subjective:    Patient ID: Grant Torres, male    DOB: October 11, 1961.  Grant Torres is being seen in consultation for management of currently uncontrolled symptomatic diabetes requested by  Coral Spikes, DO.   Past Medical History:  Diagnosis Date   Diabetes mellitus without complication ([place])    Hyperlipidemia    Hypertension    Microproteinuria     Past Surgical History:  Procedure Laterality Date   CERVICAL SPINE SURGERY  11/25/2015   COLONOSCOPY N/A 04/10/2018   Procedure: COLONOSCOPY;  Surgeon: Rogene Houston, MD;  Location: [facility];  Service: Endoscopy;  Laterality: N/A;  200   POLYPECTOMY  04/10/2018   Procedure: POLYPECTOMY;  Surgeon: Rogene Houston, MD;  Location: [facility];  Service: Endoscopy;;  colon   VASECTOMY     WISDOM TOOTH EXTRACTION      Social History   Socioeconomic History   Marital status: Married    Spouse name: Not on file   Number of children: Not on file   Years of education: Not on file   Highest education level: Not on file  Occupational History   Not on file  Tobacco Use   Smoking status: Never   Smokeless tobacco: Never  Vaping Use   Vaping Use: Never used  Substance and Sexual Activity   Alcohol use: Not on file    Comment: occasionally   Drug use: Never   Sexual activity: Not on file  Other Topics Concern   Not on file  Social History Narrative   Not on file   Social Determinants of Health   Financial Resource Strain: Not on file  Food Insecurity: Not on file  Transportation Needs: Not on file  Physical Activity: Not on file  Stress: Not on file  Social Connections: Not on file    Family History  Problem Relation Age of Onset   Kidney disease Mother    Diabetes Mother    Heart attack Mother    Stroke Mother    Heart failure Mother    Heart attack Father    Hypertension  Sister    Colon cancer Neg Hx    Thyroid disease Neg Hx     Outpatient Encounter Medications as of 06/14/2022  Medication Sig   aspirin EC 81 MG tablet Take 81 mg by mouth daily.   atorvastatin (LIPITOR) 40 MG tablet Take 1 tablet (40 mg total) by mouth at bedtime. Take 1/2 tab p.o. daily.   blood glucose meter kit and supplies KIT Dispense based on patient and insurance preference. Use to test blood sugar twice daily. ICD 10 code E11.9   Blood Glucose Monitoring Suppl (ONETOUCH VERIO) w/Device KIT TEST ONCE DAILY   empagliflozin (JARDIANCE) 25 MG TABS tablet Take 1 tablet (25 mg total) by mouth daily.   enalapril (VASOTEC) 20 MG tablet Take 1 tablet by mouth once daily APPT IS REQUIRED FOR FUTURE REFILLS   glucose blood test strip Test blood sugar once daily   levothyroxine (SYNTHROID) 50 MCG tablet Take 1 tablet (  50 mcg total) by mouth daily before breakfast.   metFORMIN (GLUCOPHAGE-XR) 500 MG 24 hr tablet Take 1 tablet (500 mg total) by mouth 2 (two) times daily after a meal.   OneTouch Delica Lancets 43P MISC USE TO TEST BLOOD SUGAR ONCE A DAY   [DISCONTINUED] atorvastatin (LIPITOR) 40 MG tablet Take 1/2 tab p.o. daily.   [DISCONTINUED] blood glucose meter kit and supplies KIT Dispense based on patient and insurance preference. Use to test blood sugar once daily. ICD 10 code E11.9   [DISCONTINUED] enalapril (VASOTEC) 20 MG tablet Take 1 tablet by mouth once daily APPT IS REQUIRED FOR FUTURE REFILLS   [DISCONTINUED] JARDIANCE 25 MG TABS tablet Take 1 tablet by mouth once daily   [DISCONTINUED] levothyroxine (SYNTHROID) 50 MCG tablet Take 50 mcg by mouth daily before breakfast.   [DISCONTINUED] metFORMIN (GLUCOPHAGE-XR) 500 MG 24 hr tablet Take 4 tablets (2,000 mg total) by mouth daily.   [DISCONTINUED] Semaglutide (RYBELSUS) 3 MG TABS Take 3 mg by mouth daily.   No facility-administered encounter medications on file as of 06/14/2022.    ALLERGIES: No Known Allergies  VACCINATION  STATUS: Immunization History  Administered Date(s) Administered   Influenza, Quadrivalent, Recombinant, Inj, Pf 08/04/2019   Influenza,inj,Quad PF,6+ Mos 10/26/2015, 08/15/2018   Influenza-Unspecified 08/05/2013, 07/26/2016, 07/22/2020, 10/10/2021   PFIZER(Purple Top)SARS-COV-2 Vaccination 01/03/2020, 01/24/2020   Pneumococcal-Unspecified 11/13/2002   Tdap 11/26/2018    Diabetes He presents for his follow-up diabetic visit. He has type 2 diabetes mellitus. Onset time: He was diagnosed at approximate age of 61 years. His disease course has been improving. There are no hypoglycemic associated symptoms. Pertinent negatives for hypoglycemia include no confusion, headaches, pallor or seizures. There are no diabetic associated symptoms. Pertinent negatives for diabetes include no chest pain, no fatigue, no polydipsia, no polyphagia, no polyuria and no weakness. There are no hypoglycemic complications. Symptoms are improving. There are no diabetic complications. Risk factors for coronary artery disease include diabetes mellitus, dyslipidemia, hypertension, male sex and family history. Current diabetic treatments: He is currently on metformin 500 mg p.o. twice daily, Jardiance 25 mg p.o. once a day. His weight is decreasing steadily (Patient reports that historically he weighed as high as 211 pounds, currently 169 pounds.). He is following a generally unhealthy diet. When asked about meal planning, he reported none. He has not had a previous visit with a dietitian. He participates in exercise intermittently. His home blood glucose trend is decreasing steadily. His breakfast blood glucose range is generally 140-180 mg/dl. His lunch blood glucose range is generally 140-180 mg/dl. His dinner blood glucose range is generally 140-180 mg/dl. His bedtime blood glucose range is generally 140-180 mg/dl. His overall blood glucose range is 140-180 mg/dl. (He brought a meter which showed improving blood glucose, average 174  for the last 7 days.  He feels much better.  His recent A1c was 7.3%.  ) An ACE inhibitor/angiotensin II receptor blocker is being taken.  Hyperlipidemia This is a chronic problem. The current episode started more than 1 year ago. The problem is controlled. Exacerbating diseases include diabetes. Pertinent negatives include no chest pain, myalgias or shortness of breath. Risk factors for coronary artery disease include diabetes mellitus, dyslipidemia, family history, hypertension and male sex.  Hypertension This is a chronic problem. The current episode started more than 1 year ago. Pertinent negatives include no chest pain, headaches, neck pain, palpitations or shortness of breath. Risk factors for coronary artery disease include dyslipidemia, diabetes mellitus and family history. Past treatments include  ACE inhibitors. Identifiable causes of hypertension include a thyroid problem.  Thyroid Problem Presents for initial visit. Onset time: Patient is known to have multinodular goiter between 2017-2019 and thyroid sonograms.  He also has multiple nodules in his thyroid.  1 nodule was biopsied with benign outcomes. Patient reports no constipation, diarrhea, fatigue or palpitations. Past treatments include levothyroxine. Prior procedures include thyroid FNA. The following procedures have not been performed: radioiodine uptake scan and thyroidectomy. His past medical history is significant for diabetes and hyperlipidemia.     Review of Systems  Constitutional:  Negative for chills, fatigue, fever and unexpected weight change.  HENT:  Negative for dental problem, mouth sores and trouble swallowing.   Eyes:  Negative for visual disturbance.  Respiratory:  Negative for cough, choking, chest tightness, shortness of breath and wheezing.   Cardiovascular:  Negative for chest pain, palpitations and leg swelling.  Gastrointestinal:  Negative for abdominal distention, abdominal pain, constipation, diarrhea,  nausea and vomiting.  Endocrine: Negative for polydipsia, polyphagia and polyuria.  Genitourinary:  Negative for dysuria, flank pain, hematuria and urgency.  Musculoskeletal:  Negative for back pain, gait problem, myalgias and neck pain.  Skin:  Negative for pallor, rash and wound.  Neurological:  Negative for seizures, syncope, weakness, numbness and headaches.  Psychiatric/Behavioral:  Negative for confusion and dysphoric mood.     Objective:       06/14/2022   11:16 AM 05/30/2022    8:26 AM 02/27/2022    7:41 AM  Vitals with BMI  Height _0  _1  _2   Weight 169 lbs 13 oz 175 lbs 10 oz 180 lbs  BMI 24.36 41.6 60.63  Systolic 016 010 932  Diastolic 64 66 74  Pulse 84 72 85    BP 110/64   Pulse 84   Ht _3  (1.778 m)   Wt 169 lb 12.8 oz (77 kg)   BMI 24.36 kg/m   Wt Readings from Last 3 Encounters:  06/14/22 169 lb 12.8 oz (77 kg)  05/30/22 175 lb 9.6 oz (79.7 kg)  02/27/22 180 lb (81.6 kg)      CMP ( most recent) CMP     Component Value Date/Time   NA 141 05/30/2022 0915   K 4.3 05/30/2022 0915   CL 103 05/30/2022 0915   CO2 23 05/30/2022 0915   GLUCOSE 186 (H) 05/30/2022 0915   GLUCOSE 224 (H) 10/20/2014 0937   BUN 13 05/30/2022 0915   CREATININE 0.69 (L) 05/30/2022 0915   CREATININE 0.77 10/20/2014 0937   CALCIUM 9.7 05/30/2022 0915   PROT 7.2 05/30/2022 0915   ALBUMIN 4.7 05/30/2022 0915   AST 18 05/30/2022 0915   ALT 27 05/30/2022 0915   ALKPHOS 55 05/30/2022 0915   BILITOT 0.6 05/30/2022 0915   GFRNONAA 103 08/01/2020 0904   GFRAA 120 08/01/2020 0904     Diabetic Labs (most recent): Lab Results  Component Value Date   HGBA1C 7.3 (A) 05/30/2022   HGBA1C 6.8 (H) 08/16/2021   HGBA1C 7.0 (H) 01/30/2021   MICROALBUR 3.6 (H) 10/20/2014   MICROALBUR 1.99 (H) 09/01/2013     Lipid Panel ( most recent) Lipid Panel     Component Value Date/Time   CHOL 107 05/30/2022 0915   TRIG 56 05/30/2022 0915   HDL 46 05/30/2022 0915   CHOLHDL 2.3  05/30/2022 0915   CHOLHDL 2.9 10/20/2014 0937   VLDL 20 10/20/2014 0937   LDLCALC 48 05/30/2022 0915   LABVLDL 13 05/30/2022 0915  Lab Results  Component Value Date   TSH 4.130 05/30/2022   TSH 2.22 08/16/2021   TSH 2.66 08/16/2020   TSH 2.710 09/18/2019   TSH 2.76 08/11/2018   TSH 3.880 01/13/2016   FREET4 1.39 05/30/2022   FREET4 1.10 08/16/2021   FREET4 1.14 08/16/2020   FREET4 1.65 09/18/2019   FREET4 1.05 08/11/2018           Assessment & Plan:   1. Type 2 diabetes mellitus without complication, without long-term current use of insulin ([place])  - Grant Torres has currently uncontrolled symptomatic type 2 DM since  61 years of age,  with most recent A1c of 7.3 %.  He presents with significant improvement in his glycemic profile.  Recent labs reviewed. - I had a long discussion with him about the progressive nature of diabetes and the pathology behind its complications. -He does not report any gross complications from his diabetes, however he has comorbid conditions of hyperlipidemia, hypertension, and patient remains at a high risk for more acute and chronic complications which include CAD, CVA, CKD, retinopathy, and neuropathy. These are all discussed in detail with him.  - I discussed all available options of managing his diabetes including de-escalation of medications. I have counseled him on diet  and weight management  by adopting a Whole Food , Plant Predominant  ( WFPP) nutrition as recommended by [employer] of Lifestyle Medicine. Patient is encouraged to switch to  unprocessed or minimally processed  complex starch, adequate protein intake (mainly plant source), minimal liquid fat ( mainly vegetable oils), plenty of fruits, and vegetables. -  he is advised to stick to a routine mealtimes to eat 3 complete meals a day and snack only when necessary ( to snack only to correct hypoglycemia BG <70 day time or <100 at night).   - he engaged reasonably to  lifestyle medicine, acknowledges that there is a room for improvement in his food and drink choices. - Suggestion is made for him to avoid simple carbohydrates  from his diet including Cakes, Sweet Desserts, Ice Cream, Soda (diet and regular), Sweet Tea, Candies, Chips, Cookies, Store Bought Juices, Alcohol , Artificial Sweeteners,  Coffee Creamer, and "Sugar-free" Products, Lemonade. This will help patient to have more stable blood glucose profile and potentially avoid unintended weight gain.  The following Lifestyle Medicine recommendations according to [facility]) were discussed and and offered to patient and he  agrees to start the journey:  A. Whole Foods, Plant-Based Nutrition comprising of fruits and vegetables, plant-based proteins, whole-grain carbohydrates was discussed in detail with the patient.   A list for source of those nutrients were also provided to the patient.  Patient will use only water or unsweetened tea for hydration. B.  The need to stay away from risky substances including alcohol, smoking; obtaining 7 to 9 hours of restorative sleep, at least 150 minutes of moderate intensity exercise weekly, the importance of healthy social connections,  and stress management techniques were discussed. C.  A full color page of  Calorie density of various food groups per pound showing examples of each food groups was provided to the patient.  - he will be scheduled with Jearld Fenton, RDN, CDE for individualized diabetes education.  - I have approached him with the following plan to manage  his diabetes and patient agrees:   -This patient will continue to benefit from de-escalation in his medications.  He is advised to be off of  glipizide and Rybelsus for now.   He is advised to continue metformin 500 mg XR p.o. twice daily, continue Jardiance 25 mg p.o. once daily for now.  Since he is approaching his ideal weight, he will be considered for lower dose of  Jardiance 10 mg during his next refill.      He is advised to continue monitoring blood glucose twice a day-daily before breakfast and at bedtime.   - he is encouraged to call clinic for blood glucose levels less than 70 or above 200 mg /dl.  - Specific targets for  A1c;  LDL, HDL,  and Triglycerides were discussed with the patient.  2) Blood Pressure /Hypertension:  his blood pressure is  controlled to target.   he is advised to continue his current medications including enalapril 20 mg once a day with breakfast . 3) Lipids/Hyperlipidemia:   Review of his recent lipid panel showed  controlled  LDL at 66 .  he  is advised to continue   lovastatin 40 mg p.o. daily at bedtime. Side effects and precautions discussed with him.  4)  Weight/Diet:  Body mass index is 24.36 kg/m.  -    he is not a candidate for weight loss. I discussed with him the fact that loss of 5 - 10% of his  current body weight will have the most impact on his diabetes management.  The above detailed  AC[facility] recommendations for nutrition, exercise, sleep, social life, avoidance of risky substances, the need for restorative sleep   information will also detailed on discharge instructions.   5) hypothyroidism: His thyroid function tests are within appropriate replacement.  He is advised to continue levothyroxine 50 mcg p.o. daily before breakfast.    - We discussed about the correct intake of his thyroid hormone, on empty stomach at fasting, with water, separated by at least 30 minutes from breakfast and other medications,  and separated by more than 4 hours from calcium, iron, multivitamins, acid reflux medications (PPIs). -Patient is made aware of the fact that thyroid hormone replacement is needed for life, dose to be adjusted by periodic monitoring of thyroid function tests.   6) multinodular goiter: Review of his ultrasound between 2017-2019 documented multinodular goiter.  He does have one of his nodules biopsied with benign  outcomes.  He wishes to postpone surveillance thyroid ultrasound until after his next visit.    7) Chronic Care/Health Maintenance:  -he  is on ACEI/ARB and Statin medications and  is encouraged to initiate and continue to follow up with Ophthalmology, Dentist,  Podiatrist at least yearly or according to recommendations, and advised to   stay away from smoking. I have recommended yearly flu vaccine and pneumonia vaccine at least every 5 years; moderate intensity exercise for up to 150 minutes weekly; and  sleep for 7- 9 hours a day.  - he is  advised to maintain close follow up with Coral Spikes, DO for primary care needs, as well as his other providers for optimal and coordinated care.   I spent 40 minutes in the care of the patient today including review of labs from [place], Lipids, Thyroid Function, Hematology (current and previous including abstractions from other facilities); face-to-face time discussing  his blood glucose readings/logs, discussing hypoglycemia and hyperglycemia episodes and symptoms, medications doses, his options of short and long term treatment based on the latest standards of care / guidelines;  discussion about incorporating lifestyle medicine;  and documenting the encounter. Risk reduction counseling performed per  USPSTF guidelines to reduce  and cardiovascular risk factors.     Please refer to Patient Instructions for Blood Glucose Monitoring and Insulin/Medications Dosing Guide"  in media tab for additional information. Please  also refer to " Patient Self Inventory" in the Media  tab for reviewed elements of pertinent patient history.  Grant Torres participated in the discussions, expressed understanding, and voiced agreement with the above plans.  All questions were answered to his satisfaction. he is encouraged to contact clinic should he have any questions or concerns prior to his return visit.    Follow up plan: - Return in about 6 months (around 12/15/2022) for F/U  with Pre-visit Labs, Meter/CGM/Logs, A1c here.  Glade Lloyd, MD [facility] [address] [place], [place] [address] Phone: [phone]  Fax: [phone]    06/14/2022, 6:24 PM  This note was partially dictated with voice recognition software. Similar sounding words can be transcribed inadequately or may not  be corrected upon review.

## 2022-06-20 ENCOUNTER — Encounter: Payer: Self-pay | Admitting: Family Medicine

## 2022-06-20 ENCOUNTER — Ambulatory Visit (INDEPENDENT_AMBULATORY_CARE_PROVIDER_SITE_OTHER): Payer: BC Managed Care – PPO | Admitting: Family Medicine

## 2022-06-20 DIAGNOSIS — E038 Other specified hypothyroidism: Secondary | ICD-10-CM | POA: Diagnosis not present

## 2022-06-20 DIAGNOSIS — I10 Essential (primary) hypertension: Secondary | ICD-10-CM

## 2022-06-20 DIAGNOSIS — E119 Type 2 diabetes mellitus without complications: Secondary | ICD-10-CM | POA: Diagnosis not present

## 2022-06-20 DIAGNOSIS — E782 Mixed hyperlipidemia: Secondary | ICD-10-CM | POA: Diagnosis not present

## 2022-06-20 NOTE — Progress Notes (Signed)
Subjective:  Patient ID: Grant Torres, male    DOB: 01/29/1961  Age: 61 y.o. MRN: [id number]  CC: Chief Complaint  Patient presents with   Hypertension    No issues with blood pressure. Pt is having tendinitis in right elbow. 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 Pt has began to wear elbow brace and do exercises. Pt went to endo and has been placed a plant based diet    HPI:  61 year old male with hypertension, DM-2, Hypothyroidism, HLD presents for follow up.   DM-2 Most recent A1C 7.2 (8/2).  Follows with Endo. Rybelsus and Dicky Doe have been discontinued.  He has made a lot of strides with focusing on his diet.  His diet is mostly plant-based. Currently on metformin and Jardiance.  Hypothyroidism Stable on current dose of Synthroid.  Hyperlipidemia Very well controlled with an LDL of 48.  Compliant with atorvastatin 40 mg daily.  Hypertension Well-controlled on enalapril.  Patient Active Problem List   Diagnosis Date Noted   Essential hypertension 12/21/2021   Hypothyroidism 08/18/2019   Multinodular goiter 08/11/2018   Mixed hyperlipidemia 09/01/2013   Diabetes ([place]) 09/01/2013    Social Hx   Social History   Socioeconomic History   Marital status: Married    Spouse name: Not on file   Number of children: Not on file   Years of education: Not on file   Highest education level: Not on file  Occupational History   Not on file  Tobacco Use   Smoking status: Never   Smokeless tobacco: Never  Vaping Use   Vaping Use: Never used  Substance and Sexual Activity   Alcohol use: Not on file    Comment: occasionally   Drug use: Never   Sexual activity: Not on file  Other Topics Concern   Not on file  Social History Narrative   Not on file   Social Determinants of Health   Financial Resource Strain: Not on file  Food Insecurity: Not on file  Transportation Needs: Not on file  Physical Activity: Not on file  Stress: Not on  file  Social Connections: Not on file    Review of Systems  Constitutional: Negative.   Respiratory: Negative.    Cardiovascular: Negative.    Objective:  BP 132/68   Pulse 68   Temp 97.6 F (36.4 C)   Wt 169 lb 12.8 oz (77 kg)   SpO2 96%   BMI 24.36 kg/m      06/20/2022    8:34 AM 06/14/2022   11:16 AM 05/30/2022    8:26 AM  BP/Weight  Systolic BP 509 326 712  Diastolic BP 68 64 66  Wt. (Lbs) 169.8 169.8 175.6  BMI 24.36 kg/m2 24.36 kg/m2 25.2 kg/m2    Physical Exam Vitals and nursing note reviewed.  Constitutional:      General: He is not in acute distress.    Appearance: Normal appearance. He is not ill-appearing.  HENT:     Head: Normocephalic and atraumatic.  Eyes:     General:        Right eye: No discharge.        Left eye: No discharge.     Conjunctiva/sclera: Conjunctivae normal.  Cardiovascular:     Rate and Rhythm: Normal rate and regular rhythm.  Pulmonary:     Effort: Pulmonary effort is normal.     Breath sounds: Normal breath sounds. No wheezing, rhonchi or rales.  Abdominal:     General: There is no distension.     Palpations: Abdomen is soft.     Tenderness: There is no abdominal tenderness.  Neurological:     Mental Status: He is alert.  Psychiatric:        Mood and Affect: Mood normal.        Behavior: Behavior normal.     Lab Results  Component Value Date   WBC 4.9 01/30/2021   HGB 15.5 01/30/2021   HCT 45.8 01/30/2021   PLT 193 01/30/2021   GLUCOSE 186 (H) 05/30/2022   CHOL 107 05/30/2022   TRIG 56 05/30/2022   HDL 46 05/30/2022   LDLCALC 48 05/30/2022   ALT 27 05/30/2022   AST 18 05/30/2022   NA 141 05/30/2022   K 4.3 05/30/2022   CL 103 05/30/2022   CREATININE 0.69 (L) 05/30/2022   BUN 13 05/30/2022   CO2 23 05/30/2022   TSH 4.130 05/30/2022   PSA 2.23 10/20/2014   HGBA1C 7.3 (A) 05/30/2022   MICROALBUR 3.6 (H) 10/20/2014     Assessment & Plan:   Problem List Items Addressed This Visit       Cardiovascular  and Mediastinum   Essential hypertension    At goal.  Continue enalapril.        Endocrine   Diabetes ([place])    Patient's medications have been de-escalated.  He is doing fairly well at this time.  Continue Jardiance and metformin.  Continue close follow-up with endocrinology.  He has made significant progress with dietary changes.      Hypothyroidism    Stable.  Continue current dose of Synthroid.        Other   Mixed hyperlipidemia    Well-controlled.  Continue Lipitor.       Follow-up:  Return in about 6 months (around 12/21/2022).  [place]

## 2022-06-20 NOTE — Assessment & Plan Note (Signed)
Well-controlled.  Continue Lipitor. 

## 2022-06-20 NOTE — Patient Instructions (Signed)
Keep up the good work.  Follow up in 6 months.  Take care  Dr. Lacinda Axon

## 2022-06-20 NOTE — Assessment & Plan Note (Signed)
At goal.  Continue enalapril. 

## 2022-06-20 NOTE — Assessment & Plan Note (Signed)
Patient's medications have been de-escalated.  He is doing fairly well at this time.  Continue Jardiance and metformin.  Continue close follow-up with endocrinology.  He has made significant progress with dietary changes.

## 2022-06-20 NOTE — Assessment & Plan Note (Signed)
Stable. Continue current dose of Synthroid. 

## 2022-06-21 ENCOUNTER — Telehealth: Payer: Self-pay | Admitting: "Endocrinology

## 2022-06-21 DIAGNOSIS — E119 Type 2 diabetes mellitus without complications: Secondary | ICD-10-CM

## 2022-06-21 LAB — COMPREHENSIVE METABOLIC PANEL
ALT: 19 IU/L (ref 0–44)
AST: 20 IU/L (ref 0–40)
Albumin/Globulin Ratio: 2.2 (ref 1.2–2.2)
Albumin: 4.7 g/dL (ref 3.8–4.9)
Alkaline Phosphatase: 57 IU/L (ref 44–121)
BUN/Creatinine Ratio: 25 — ABNORMAL HIGH (ref 10–24)
BUN: 16 mg/dL (ref 8–27)
Bilirubin Total: 0.7 mg/dL (ref 0.0–1.2)
CO2: 25 mmol/L (ref 20–29)
Calcium: 9.3 mg/dL (ref 8.6–10.2)
Chloride: 102 mmol/L (ref 96–106)
Creatinine, Ser: 0.65 mg/dL — ABNORMAL LOW (ref 0.76–1.27)
Globulin, Total: 2.1 g/dL (ref 1.5–4.5)
Glucose: 146 mg/dL — ABNORMAL HIGH (ref 70–99)
Potassium: 4.7 mmol/L (ref 3.5–5.2)
Sodium: 139 mmol/L (ref 134–144)
Total Protein: 6.8 g/dL (ref 6.0–8.5)
eGFR: 108 mL/min/{1.73_m2} (ref >59)

## 2022-06-21 NOTE — Telephone Encounter (Signed)
Order added.

## 2022-06-21 NOTE — Telephone Encounter (Signed)
Can you add an CMP? He did it after his visit this week and it was suppose to be a week before appt in Feb.

## 2022-06-25 ENCOUNTER — Encounter: Payer: Self-pay | Admitting: Nutrition

## 2022-06-25 ENCOUNTER — Encounter: Payer: BC Managed Care – PPO | Attending: Family Medicine | Admitting: Nutrition

## 2022-06-25 VITALS — Ht 71.5 in | Wt 167.0 lb

## 2022-06-25 DIAGNOSIS — I10 Essential (primary) hypertension: Secondary | ICD-10-CM | POA: Diagnosis not present

## 2022-06-25 DIAGNOSIS — E782 Mixed hyperlipidemia: Secondary | ICD-10-CM | POA: Diagnosis not present

## 2022-06-25 DIAGNOSIS — E118 Type 2 diabetes mellitus with unspecified complications: Secondary | ICD-10-CM | POA: Insufficient documentation

## 2022-06-25 NOTE — Patient Instructions (Addendum)
Goals  Focus on eat 45-60 g CHO at meals Focus on plant based foods. Keep up the great job. Avoid snacking after supper. Consider walking 30 minutes or other exercises a few times per week. Get A1C to 6.5% or below.

## 2022-06-25 NOTE — Progress Notes (Signed)
Medical Nutrition Therapy  Appointment Start time:  0930  Appointment End time:  54  Primary concerns today: Dm Type 2  Referral diagnosis: E11.8 Preferred learning style: visual  Learning readiness: Changes in progress   NUTRITION ASSESSMENT  61 yr old wmale here with his wife. DX DM Type 2, 20 yrs ago. Saw Dr. Leta Jungling Endocrinology recently and started on Lifestyle Medicine. He and his wife have changed to plant based foods Here with his wife.  Use to eat a lot out, conveniece and processed foods. Now cooking at home and meal prepping and meal planning. Now is changed to plant based foods.  Use to snack a lot. A1C 7.3%, highest 8.1%/ Jardiance,  Metformin 500   His diet may be too restrictive of CHO at times, effecting his blood sugars.  Lab Results  Component Value Date   HGBA1C 7.3 (A) 05/30/2022   Bedtime blood sugar:  202 mg/dl.  FBS 160  mg/dl.  Anthropometrics  Wt Readings from Last 3 Encounters:  06/25/22 167 lb (75.8 kg)  06/20/22 169 lb 12.8 oz (77 kg)  06/14/22 169 lb 12.8 oz (77 kg)   Ht Readings from Last 3 Encounters:  06/25/22 5' 11.5" (1.816 m)  06/14/22 '5\' 10"'$  (1.778 m)  05/30/22 '5\' 10"'$  (1.778 m)   Body mass index is 22.97 kg/m. '@BMIFA'$ @ Facility age limit for growth %iles is 20 years. Facility age limit for growth %iles is 20 years.    Clinical Medical Hx:  Dm Type 2 Medications: Metformin and Jardiance Labs: Lab Results  Component Value Date   HGBA1C 7.3 (A) 05/30/2022      Latest Ref Rng & Units 06/20/2022    9:40 AM 05/30/2022    9:15 AM 01/30/2021    9:10 AM  CMP  Glucose 70 - 99 mg/dL 146  186  141   BUN 8 - 27 mg/dL '16  13  15   '$ Creatinine 0.76 - 1.27 mg/dL 0.65  0.69  0.71   Sodium 134 - 144 mmol/L 139  141  144   Potassium 3.5 - 5.2 mmol/L 4.7  4.3  4.5   Chloride 96 - 106 mmol/L 102  103  103   CO2 20 - 29 mmol/L '25  23  24   '$ Calcium 8.6 - 10.2 mg/dL 9.3  9.7  9.4   Total Protein 6.0 - 8.5 g/dL 6.8  7.2  7.1   Total  Bilirubin 0.0 - 1.2 mg/dL 0.7  0.6  0.5   Alkaline Phos 44 - 121 IU/L 57  55  54   AST 0 - 40 IU/L '20  18  18   '$ ALT 0 - 44 IU/L '19  27  18     '$ Notable Signs/Symptoms: None  Lifestyle & Dietary Hx Lives with his wife and has 2 kids.    Estimated daily fluid intake: 128 oz Supplements: Vit D Sleep: 7/8 Stress / self-care: none Current average weekly physical activity: Walking at lunch-30 mins.  24-Hr Dietary Recall First Meal: Kashi cereal with almond 1 cup, 1/2 banana,  Snack:  Second Meal: caulflower spaghtetti, soy crumbells in tomato sauces  with vegan cheese, water Snack: peanuts, popcorn Third Meal: Killer dave bread, tomato, lettuce, vegaize and vegan bacon, pickles dill, water, apple Snack:  Beverages: water  Estimated Energy Needs Calories: 2000-2200 Carbohydrate: 225g Protein: 150g Fat: 58g   NUTRITION DIAGNOSIS  NB-1.1 Food and nutrition-related knowledge deficit As related to Diabetes Type 2.  As evidenced by A1c 7.3%.  NUTRITION INTERVENTION  Nutrition education (E-1) on the following topics:  Nutrition and Diabetes education provided on My Plate, CHO counting, meal planning, portion sizes, timing of meals, avoiding snacks between meals unless having a low blood sugar, target ranges for A1C and blood sugars, signs/symptoms and treatment of hyper/hypoglycemia, monitoring blood sugars, taking medications as prescribed, benefits of exercising 30 minutes per day and prevention of complications of DM. Lifestyle Medicine  - Whole Food, Plant Predominant Nutrition is highly recommended: Eat Plenty of vegetables, Mushrooms, fruits, Legumes, Whole Grains, Nuts, seeds in lieu of processed meats, processed snacks/pastries red meat, poultry, eggs.    -It is better to avoid simple carbohydrates including: Cakes, Sweet Desserts, Ice Cream, Soda (diet and regular), Sweet Tea, Candies, Chips, Cookies, Store Bought Juices, Alcohol in Excess of  1-2 drinks a day, Lemonade,   Artificial Sweeteners, Doughnuts, Coffee Creamers, "Sugar-free" Products, etc, etc.  This is not a complete list.....  Exercise: If you are able: 30 -60 minutes a day ,4 days a week, or 150 minutes a week.  The longer the better.  Combine stretch, strength, and aerobic activities.  If you were told in the past that you have high risk for cardiovascular diseases, you may seek evaluation by your heart doctor prior to initiating moderate to intense exercise programs.   Handouts Provided Include  Meal Plan Card Food label reading  Learning Style & Readiness for Change Teaching method utilized: Visual & Auditory  Demonstrated degree of understanding via: Teach Back  Barriers to learning/adherence to lifestyle change: none   Goals Established by Pt Goals  Focus on eat 45-60 g CHO at meals Focus on plant based foods. Keep up the great job. Avoid snacking after supper. Consider walking 30 minutes or other exercises a few times per week. Get A1C to 6.5% or below.   MONITORING & EVALUATION Dietary intake, weekly physical activity, and blood sugars in 3 months.  Next Steps  Patient is to work on meal planning and exercising.Marland Kitchen

## 2022-07-18 ENCOUNTER — Other Ambulatory Visit: Payer: Self-pay | Admitting: "Endocrinology

## 2022-08-16 ENCOUNTER — Ambulatory Visit: Payer: Managed Care, Other (non HMO) | Admitting: Endocrinology

## 2022-08-29 ENCOUNTER — Other Ambulatory Visit: Payer: Self-pay | Admitting: "Endocrinology

## 2022-08-29 DIAGNOSIS — E038 Other specified hypothyroidism: Secondary | ICD-10-CM

## 2022-09-05 ENCOUNTER — Other Ambulatory Visit: Payer: Self-pay | Admitting: "Endocrinology

## 2022-09-26 ENCOUNTER — Ambulatory Visit: Payer: BC Managed Care – PPO | Admitting: Nutrition

## 2022-10-09 ENCOUNTER — Other Ambulatory Visit: Payer: Self-pay | Admitting: "Endocrinology

## 2022-11-05 ENCOUNTER — Encounter: Payer: BC Managed Care – PPO | Attending: Family Medicine | Admitting: Nutrition

## 2022-11-05 ENCOUNTER — Other Ambulatory Visit: Payer: Self-pay | Admitting: "Endocrinology

## 2022-11-05 ENCOUNTER — Encounter: Payer: Self-pay | Admitting: Nutrition

## 2022-11-05 VITALS — Ht 70.5 in | Wt 161.0 lb

## 2022-11-05 DIAGNOSIS — E118 Type 2 diabetes mellitus with unspecified complications: Secondary | ICD-10-CM | POA: Diagnosis not present

## 2022-11-05 DIAGNOSIS — I10 Essential (primary) hypertension: Secondary | ICD-10-CM | POA: Insufficient documentation

## 2022-11-05 DIAGNOSIS — E119 Type 2 diabetes mellitus without complications: Secondary | ICD-10-CM

## 2022-11-05 DIAGNOSIS — E782 Mixed hyperlipidemia: Secondary | ICD-10-CM | POA: Diagnosis not present

## 2022-11-05 NOTE — Patient Instructions (Addendum)
Goals  Keep up the great job Caution on processed plant based foods Continue to build more plant based foods  Get A1C down to 5.7%

## 2022-11-05 NOTE — Progress Notes (Addendum)
Medical Nutrition Therapy  Appointment Start time:  0830 Appointment End time:  0900  Primary concerns today: Dm Type 2  Referral diagnosis: E11.8 Preferred learning style: visual  Learning readiness: Changes in progress   NUTRITION ASSESSMENT Follow up 62 yr old wmale. DX DM Type 2, 20 yrs ago. Saw Dr. Leta Jungling Endocrinology recently and started on Lifestyle Medicine. He and his wife have changed to plant based foods. His main personal goal is to get off of his DM medications and reverse his DM. Success so far:   Last visit was Aug 3rd.. Wife has lost 50 lbs. He has lost 15 lbs. Feels a lot better, more energy. Not sluggish.and GI system much better.  Sleep is better. Waking up less. Has downloaded mysugar app. And uses that a lot and feels it has helped him stay on course.  FBS before breakfast  130-140's and bedtime 160-180's. A few higher numbers during the holidays due to eating later or eating a few more carbohydrates that normal.  Has cut out red meat, chicken, pork. Favorite vegetables is cauliflower now. Eats tofu and a lot more salads with whole grains and beans. Has been eating some commercialized plant based foods; impossible burgers, vegan bratwurst. Encouraged to avoid them.  Medications.: Jardiance,  Metformin 500 Walks some and a lot more active overall.  To see Dr. Dorris Fetch next month Lab Results  Component Value Date   HGBA1C 7.3 (A) 05/30/2022   Anthropometrics  Wt Readings from Last 3 Encounters:  06/25/22 167 lb (75.8 kg)  06/20/22 169 lb 12.8 oz (77 kg)  06/14/22 169 lb 12.8 oz (77 kg)   Ht Readings from Last 3 Encounters:  06/25/22 5' 11.5" (1.816 m)  06/14/22 '5\' 10"'$  (1.778 m)  05/30/22 '5\' 10"'$  (1.778 m)   There is no height or weight on file to calculate BMI. '@BMIFA'$ @ Facility age limit for growth %iles is 20 years. Facility age limit for growth %iles is 20 years.    Clinical Medical Hx:  Dm Type 2 Medications: Metformin and Jardiance Labs: Lab  Results  Component Value Date   HGBA1C 7.3 (A) 05/30/2022      Latest Ref Rng & Units 06/20/2022    9:40 AM 05/30/2022    9:15 AM 01/30/2021    9:10 AM  CMP  Glucose 70 - 99 mg/dL 146  186  141   BUN 8 - 27 mg/dL '16  13  15   '$ Creatinine 0.76 - 1.27 mg/dL 0.65  0.69  0.71   Sodium 134 - 144 mmol/L 139  141  144   Potassium 3.5 - 5.2 mmol/L 4.7  4.3  4.5   Chloride 96 - 106 mmol/L 102  103  103   CO2 20 - 29 mmol/L '25  23  24   '$ Calcium 8.6 - 10.2 mg/dL 9.3  9.7  9.4   Total Protein 6.0 - 8.5 g/dL 6.8  7.2  7.1   Total Bilirubin 0.0 - 1.2 mg/dL 0.7  0.6  0.5   Alkaline Phos 44 - 121 IU/L 57  55  54   AST 0 - 40 IU/L '20  18  18   '$ ALT 0 - 44 IU/L '19  27  18     '$ Notable Signs/Symptoms: None  Lifestyle & Dietary Hx Lives with his wife and has 2 kids.    Estimated daily fluid intake: 128 oz Supplements: Vit D Sleep: 7/8 Stress / self-care: none Current average weekly physical activity: Walking at lunch-30  mins.  24-Hr Dietary Recall First Meal:  oatmeal or grain berries with almond milk. Second Meal: Homemade plant based soup-beans, chick peas, greens, tomatoes. Water, Snack:  Third Meal: Baked flounder, salad and few potatoes, water  Snack:  Beverages: water  Estimated Energy Needs Calories: 2000-2200 Carbohydrate: 225g Protein: 150g Fat: 58g   NUTRITION DIAGNOSIS  NB-1.1 Food and nutrition-related knowledge deficit As related to Diabetes Type 2.  As evidenced by A1c 7.3%.   NUTRITION INTERVENTION  Nutrition education (E-1) on the following topics:  Nutrition and Diabetes education provided on My Plate, CHO counting, meal planning, portion sizes, timing of meals, avoiding snacks between meals unless having a low blood sugar, target ranges for A1C and blood sugars, signs/symptoms and treatment of hyper/hypoglycemia, monitoring blood sugars, taking medications as prescribed, benefits of exercising 30 minutes per day and prevention of complications of DM. Lifestyle  Medicine  - Whole Food, Plant Predominant Nutrition is highly recommended: Eat Plenty of vegetables, Mushrooms, fruits, Legumes, Whole Grains, Nuts, seeds in lieu of processed meats, processed snacks/pastries red meat, poultry, eggs.    -It is better to avoid simple carbohydrates including: Cakes, Sweet Desserts, Ice Cream, Soda (diet and regular), Sweet Tea, Candies, Chips, Cookies, Store Bought Juices, Alcohol in Excess of  1-2 drinks a day, Lemonade,  Artificial Sweeteners, Doughnuts, Coffee Creamers, "Sugar-free" Products, etc, etc.  This is not a complete list.....  Exercise: If you are able: 30 -60 minutes a day ,4 days a week, or 150 minutes a week.  The longer the better.  Combine stretch, strength, and aerobic activities.  If you were told in the past that you have high risk for cardiovascular diseases, you may seek evaluation by your heart doctor prior to initiating moderate to intense exercise programs.   Handouts Provided Include  Meal Plan Card Food label reading  Learning Style & Readiness for Change Teaching method utilized: Visual & Auditory  Demonstrated degree of understanding via: Teach Back  Barriers to learning/adherence to lifestyle change: none   Goals Established by Pt  Keep up the great job! Caution on processed plant based foods Continue to build more plant based foods in diet. Get A1C down to 5.7%    MONITORING & EVALUATION Dietary intake, weekly physical activity, and blood sugars in 1 months.  Next Steps  Patient is to keep focusing on lifestyle medicine.

## 2022-12-04 ENCOUNTER — Other Ambulatory Visit: Payer: Self-pay | Admitting: "Endocrinology

## 2022-12-04 DIAGNOSIS — E038 Other specified hypothyroidism: Secondary | ICD-10-CM

## 2022-12-10 DIAGNOSIS — E119 Type 2 diabetes mellitus without complications: Secondary | ICD-10-CM | POA: Diagnosis not present

## 2022-12-11 LAB — COMPREHENSIVE METABOLIC PANEL
ALT: 21 IU/L (ref 0–44)
AST: 22 IU/L (ref 0–40)
Albumin/Globulin Ratio: 2.1 (ref 1.2–2.2)
Albumin: 4.6 g/dL (ref 3.9–4.9)
Alkaline Phosphatase: 65 IU/L (ref 44–121)
BUN/Creatinine Ratio: 21 (ref 10–24)
BUN: 15 mg/dL (ref 8–27)
Bilirubin Total: 0.6 mg/dL (ref 0.0–1.2)
CO2: 22 mmol/L (ref 20–29)
Calcium: 9.7 mg/dL (ref 8.6–10.2)
Chloride: 101 mmol/L (ref 96–106)
Creatinine, Ser: 0.7 mg/dL — ABNORMAL LOW (ref 0.76–1.27)
Globulin, Total: 2.2 g/dL (ref 1.5–4.5)
Glucose: 170 mg/dL — ABNORMAL HIGH (ref 70–99)
Potassium: 4.2 mmol/L (ref 3.5–5.2)
Sodium: 139 mmol/L (ref 134–144)
Total Protein: 6.8 g/dL (ref 6.0–8.5)
eGFR: 105 mL/min/{1.73_m2} (ref >59)

## 2022-12-17 ENCOUNTER — Encounter: Payer: Self-pay | Admitting: Nutrition

## 2022-12-17 ENCOUNTER — Encounter: Payer: BC Managed Care – PPO | Attending: Family Medicine | Admitting: Nutrition

## 2022-12-17 ENCOUNTER — Encounter: Payer: Self-pay | Admitting: "Endocrinology

## 2022-12-17 ENCOUNTER — Ambulatory Visit (INDEPENDENT_AMBULATORY_CARE_PROVIDER_SITE_OTHER): Payer: BC Managed Care – PPO | Admitting: "Endocrinology

## 2022-12-17 VITALS — BP 106/64 | HR 68 | Ht 70.0 in | Wt 154.6 lb

## 2022-12-17 DIAGNOSIS — E038 Other specified hypothyroidism: Secondary | ICD-10-CM | POA: Diagnosis not present

## 2022-12-17 DIAGNOSIS — E782 Mixed hyperlipidemia: Secondary | ICD-10-CM | POA: Diagnosis not present

## 2022-12-17 DIAGNOSIS — I10 Essential (primary) hypertension: Secondary | ICD-10-CM | POA: Diagnosis not present

## 2022-12-17 DIAGNOSIS — E118 Type 2 diabetes mellitus with unspecified complications: Secondary | ICD-10-CM | POA: Insufficient documentation

## 2022-12-17 DIAGNOSIS — E119 Type 2 diabetes mellitus without complications: Secondary | ICD-10-CM | POA: Diagnosis not present

## 2022-12-17 LAB — POCT GLYCOSYLATED HEMOGLOBIN (HGB A1C): HbA1c, POC (controlled diabetic range): 6.5 % (ref 0.0–7.0)

## 2022-12-17 NOTE — Patient Instructions (Signed)
Goals  Keep up the great job. Consider recipes of Forks over [employer] book at [employer]. Get A1C 5.7% or less.

## 2022-12-17 NOTE — Patient Instructions (Signed)

## 2022-12-17 NOTE — Progress Notes (Signed)
Endocrinology Consult Note       12/17/2022, 12:59 PM   Subjective:    Patient ID: Grant Torres, male    DOB: 12-14-1960.  Grant Torres is being seen in consultation for management of currently uncontrolled symptomatic diabetes requested by  Coral Spikes, DO.   Past Medical History:  Diagnosis Date   Diabetes mellitus without complication ([place])    Hyperlipidemia    Hypertension    Microproteinuria     Past Surgical History:  Procedure Laterality Date   CERVICAL SPINE SURGERY  11/25/2015   COLONOSCOPY N/A 04/10/2018   Procedure: COLONOSCOPY;  Surgeon: Rogene Houston, MD;  [facility];  Service: Endoscopy;  Laterality: N/A;  200   POLYPECTOMY  04/10/2018   Procedure: POLYPECTOMY;  Surgeon: Rogene Houston, MD;  Location: [facility];  Service: Endoscopy;;  colon   VASECTOMY     WISDOM TOOTH EXTRACTION      Social History   Socioeconomic History   Marital status: Married    Spouse name: Not on file   Number of children: Not on file   Years of education: Not on file   Highest education level: Not on file  Occupational History   Not on file  Tobacco Use   Smoking status: Never   Smokeless tobacco: Never  Vaping Use   Vaping Use: Never used  Substance and Sexual Activity   Alcohol use: Not on file    Comment: occasionally   Drug use: Never   Sexual activity: Not on file  Other Topics Concern   Not on file  Social History Narrative   Not on file   Social Determinants of Health   Financial Resource Strain: Not on file  Food Insecurity: Not on file  Transportation Needs: Not on file  Physical Activity: Not on file  Stress: Not on file  Social Connections: Not on file    Family History  Problem Relation Age of Onset   Kidney disease Mother    Diabetes Mother    Heart attack Mother    Stroke Mother    Heart failure Mother    Heart attack Father     Hypertension Sister    Colon cancer Neg Hx    Thyroid disease Neg Hx     Outpatient Encounter Medications as of 12/17/2022  Medication Sig   ACCU-CHEK GUIDE test strip USE 1 TWICE DAILY TO CHECK BLOOD GLUCOSE   Accu-Chek Softclix Lancets lancets USE 1  TO CHECK GLUCOSE TWICE DAILY   Accu-Chek Softclix Lancets lancets USE 1 LANCET TO CHECK GLUCOSE TWICE DAILY   aspirin EC 81 MG tablet Take 81 mg by mouth daily.   atorvastatin (LIPITOR) 40 MG tablet Take 1 tablet (40 mg total) by mouth at bedtime. Take 1/2 tab p.o. daily.   blood glucose meter kit and supplies KIT Dispense based on patient and insurance preference. Use to test blood sugar twice daily. ICD 10 code E11.9   Blood Glucose Monitoring Suppl (ONETOUCH VERIO) w/Device KIT TEST ONCE DAILY   enalapril (VASOTEC) 20 MG tablet Take 1 tablet by mouth once daily APPT IS  REQUIRED FOR FUTURE REFILLS   levothyroxine (SYNTHROID) 50 MCG tablet TAKE 1 TABLET BY MOUTH BEFORE BREAKFAST   metFORMIN (GLUCOPHAGE-XR) 500 MG 24 hr tablet Take 1 tablet (500 mg total) by mouth 2 (two) times daily after a meal.   [DISCONTINUED] empagliflozin (JARDIANCE) 10 MG TABS tablet Take 1 tablet (10 mg total) by mouth daily.   No facility-administered encounter medications on file as of 12/17/2022.    ALLERGIES: No Known Allergies  VACCINATION STATUS: Immunization History  Administered Date(s) Administered   Influenza, Quadrivalent, Recombinant, Inj, Pf 08/04/2019   Influenza,inj,Quad PF,6+ Mos 10/26/2015, 08/15/2018   Influenza-Unspecified 08/05/2013, 07/26/2016, 07/22/2020, 10/10/2021   PFIZER(Purple Top)SARS-COV-2 Vaccination 01/03/2020, 01/24/2020   Pneumococcal-Unspecified 11/13/2002   Tdap 11/26/2018    Diabetes He presents for his follow-up diabetic visit. He has type 2 diabetes mellitus. Onset time: He was diagnosed at approximate age of 62 years. His disease course has been improving. There are no hypoglycemic associated symptoms. Pertinent  negatives for hypoglycemia include no confusion, headaches, pallor or seizures. There are no diabetic associated symptoms. Pertinent negatives for diabetes include no chest pain, no fatigue, no polydipsia, no polyphagia, no polyuria and no weakness. There are no hypoglycemic complications. Symptoms are improving. There are no diabetic complications. Risk factors for coronary artery disease include diabetes mellitus, dyslipidemia, hypertension, male sex and family history. Current diabetic treatments: He is currently on metformin 500 mg p.o. twice daily, Jardiance 25 mg p.o. once a day. Weight trend: Patient reports that historically he weighed as high as 211 pounds, currently 154 pounds. He is following a generally unhealthy diet. When asked about meal planning, he reported none. He has not had a previous visit with a dietitian. He participates in exercise intermittently. His home blood glucose trend is decreasing steadily. His breakfast blood glucose range is generally 140-180 mg/dl. His lunch blood glucose range is generally 140-180 mg/dl. His dinner blood glucose range is generally 140-180 mg/dl. His bedtime blood glucose range is generally 140-180 mg/dl. His overall blood glucose range is 140-180 mg/dl. (He brought a meter which showed continued improvement in his glycemic profile, point-of-care A1c is 6.5%, improving.  He did not document any hypoglycemia.   ) An ACE inhibitor/angiotensin II receptor blocker is being taken.  Hyperlipidemia This is a chronic problem. The current episode started more than 1 year ago. The problem is controlled. Exacerbating diseases include diabetes. Pertinent negatives include no chest pain, myalgias or shortness of breath. Risk factors for coronary artery disease include diabetes mellitus, dyslipidemia, family history, hypertension and male sex.  Hypertension This is a chronic problem. The current episode started more than 1 year ago. Pertinent negatives include no chest  pain, headaches, neck pain, palpitations or shortness of breath. Risk factors for coronary artery disease include dyslipidemia, diabetes mellitus and family history. Past treatments include ACE inhibitors. Identifiable causes of hypertension include a thyroid problem.  Thyroid Problem Presents for initial visit. Onset time: Patient is known to have multinodular goiter between 2017-2019 and thyroid sonograms.  He also has multiple nodules in his thyroid.  1 nodule was biopsied with benign outcomes. Patient reports no constipation, diarrhea, fatigue or palpitations. Past treatments include levothyroxine. Prior procedures include thyroid FNA. The following procedures have not been performed: radioiodine uptake scan and thyroidectomy. His past medical history is significant for diabetes and hyperlipidemia.     Review of Systems  Constitutional:  Negative for chills, fatigue, fever and unexpected weight change.  HENT:  Negative for dental problem, mouth sores and trouble swallowing.  Eyes:  Negative for visual disturbance.  Respiratory:  Negative for cough, choking, chest tightness, shortness of breath and wheezing.   Cardiovascular:  Negative for chest pain, palpitations and leg swelling.  Gastrointestinal:  Negative for abdominal distention, abdominal pain, constipation, diarrhea, nausea and vomiting.  Endocrine: Negative for polydipsia, polyphagia and polyuria.  Genitourinary:  Negative for dysuria, flank pain, hematuria and urgency.  Musculoskeletal:  Negative for back pain, gait problem, myalgias and neck pain.  Skin:  Negative for pallor, rash and wound.  Neurological:  Negative for seizures, syncope, weakness, numbness and headaches.  Psychiatric/Behavioral:  Negative for confusion and dysphoric mood.     Objective:       12/17/2022    8:52 AM 11/05/2022    8:26 AM 06/25/2022    9:29 AM  Vitals with BMI  Height 5' 10"$  5' 10.5" 5' 11.5"  Weight 154 lbs 10 oz 161 lbs 167 lbs  BMI 22.18  [id number]  Systolic [id number]    Diastolic 64    Pulse 68      BP 106/64   Pulse 68   Ht 5' 10"$  (1.778 m)   Wt 154 lb 9.6 oz (70.1 kg)   BMI 22.18 kg/m   Wt Readings from Last 3 Encounters:  12/17/22 154 lb 9.6 oz (70.1 kg)  11/05/22 161 lb (73 kg)  06/25/22 167 lb (75.8 kg)      CMP ( most recent) CMP     Component Value Date/Time   NA 139 12/10/2022 0814   K 4.2 12/10/2022 0814   CL 101 12/10/2022 0814   CO2 22 12/10/2022 0814   GLUCOSE 170 (H) 12/10/2022 0814   GLUCOSE 224 (H) 10/20/2014 0937   BUN 15 12/10/2022 0814   CREATININE 0.70 (L) 12/10/2022 0814   CREATININE 0.77 10/20/2014 0937   CALCIUM 9.7 12/10/2022 0814   PROT 6.8 12/10/2022 0814   ALBUMIN 4.6 12/10/2022 0814   AST 22 12/10/2022 0814   ALT 21 12/10/2022 0814   ALKPHOS 65 12/10/2022 0814   BILITOT 0.6 12/10/2022 0814   GFRNONAA 103 08/01/2020 0904   GFRAA 120 08/01/2020 0904     Diabetic Labs (most recent): Lab Results  Component Value Date   HGBA1C 6.5 12/17/2022   HGBA1C 7.3 (A) 05/30/2022   HGBA1C 6.8 (H) 08/16/2021   MICROALBUR 3.6 (H) 10/20/2014   MICROALBUR 1.99 (H) 09/01/2013     Lipid Panel ( most recent) Lipid Panel     Component Value Date/Time   CHOL 107 05/30/2022 0915   TRIG 56 05/30/2022 0915   HDL 46 05/30/2022 0915   CHOLHDL 2.3 05/30/2022 0915   CHOLHDL 2.9 10/20/2014 0937   VLDL 20 10/20/2014 0937   LDLCALC 48 05/30/2022 0915   LABVLDL 13 05/30/2022 0915      Lab Results  Component Value Date   TSH 4.130 05/30/2022   TSH 2.22 08/16/2021   TSH 2.66 08/16/2020   TSH 2.710 09/18/2019   TSH 2.76 08/11/2018   TSH 3.880 01/13/2016   FREET4 1.39 05/30/2022   FREET4 1.10 08/16/2021   FREET4 1.14 08/16/2020   FREET4 1.65 09/18/2019   FREET4 1.05 08/11/2018           Assessment & Plan:   1. Type 2 diabetes mellitus without complication, without long-term current use of insulin ([place])  - BOWDEN ZILLS has currently uncontrolled symptomatic type 2 DM since   62 years of age. He brought a meter which showed continued improvement in his glycemic profile,  point-of-care A1c is 6.5%, improving.  He did not document any hypoglycemia.      He presents with significant improvement in his glycemic profile.  Recent labs reviewed. - I had a long discussion with him about the progressive nature of diabetes and the pathology behind its complications. -He does not report any gross complications from his diabetes, however he has comorbid conditions of hyperlipidemia, hypertension, and patient remains at a high risk for more acute and chronic complications which include CAD, CVA, CKD, retinopathy, and neuropathy. These are all discussed in detail with him.  - I discussed all available options of managing his diabetes including de-escalation of medications. I have counseled him on diet  and weight management  by adopting a Whole Food , Plant Predominant  ( WFPP) nutrition as recommended by [employer] of Lifestyle Medicine. Patient is encouraged to switch to  unprocessed or minimally processed  complex starch, adequate protein intake (mainly plant source), minimal liquid fat ( mainly vegetable oils), plenty of fruits, and vegetables. -  he is advised to stick to a routine mealtimes to eat 3 complete meals a day and snack only when necessary ( to snack only to correct hypoglycemia BG <70 day time or <100 at night).   - he engaged reasonably to lifestyle medicine, acknowledges that there is a room for improvement in his food and drink choices.  - Suggestion is made for him to avoid simple carbohydrates  from his diet including Cakes, Sweet Desserts, Ice Cream, Soda (diet and regular), Sweet Tea, Candies, Chips, Cookies, Store Bought Juices, Alcohol , Artificial Sweeteners,  Coffee Creamer, and "Sugar-free" Products, Lemonade. This will help patient to have more stable blood glucose profile and potentially avoid unintended weight gain.  The following Lifestyle Medicine  recommendations according to [place]  [facility]) were discussed and and offered to patient and he  agrees to start the journey:  A. Whole Foods, Plant-Based Nutrition comprising of fruits and vegetables, plant-based proteins, whole-grain carbohydrates was discussed in detail with the patient.   A list for source of those nutrients were also provided to the patient.  Patient will use only water or unsweetened tea for hydration. B.  The need to stay away from risky substances including alcohol, smoking; obtaining 7 to 9 hours of restorative sleep, at least 150 minutes of moderate intensity exercise weekly, the importance of healthy social connections,  and stress management techniques were discussed. C.  A full color page of  Calorie density of various food groups per pound showing examples of each food groups was provided to the patient.    - he is scheduled with Jearld Fenton, RDN, CDE for individualized diabetes education.  - I have approached him with the following plan to manage  his diabetes and patient agrees:   -This patient will continue to benefit from de-escalation in his medications.  Due to the fact that he has achieved adequate weight loss to a BMI of 22.2, he is advised to discontinue Jardiance.  He is advised to continue metformin 500 mg XR p.o. daily once at breakfast.      He is advised to continue monitoring blood glucose twice a day-daily before breakfast and at bedtime.   - he is encouraged to call clinic for blood glucose levels less than 70 or above 200 mg /dl.  - Specific targets for  A1c;  LDL, HDL,  and Triglycerides were discussed with the patient.  2) Blood Pressure /Hypertension: His blood pressure  is controlled to target.    he is advised to continue his current medications including enalapril 20 mg once a day with breakfast .  He will be considered for a lower dose of enalapril next visit. 3) Lipids/Hyperlipidemia:   Review of his recent  lipid panel showed  controlled  LDL at 48 .  he  is advised to continue lower dose of lovastatin 20 mg p.o. daily at bedtime.   Side effects and precautions discussed with him.  4)  Weight/Diet:  Body mass index is 22.18 kg/m.  -    he is not a candidate for weight loss. I discussed with him the fact that loss of 5 - 10% of his  current body weight will have the most impact on his diabetes management.  The above detailed  ACLM recommendations for nutrition, exercise, sleep, social life, avoidance of risky substances, the need for restorative sleep   information will also detailed on discharge instructions.   5) hypothyroidism: His thyroid function tests are within appropriate replacement.  He is advised to continue levothyroxine 50 mcg p.o. daily before breakfast.    - We discussed about the correct intake of his thyroid hormone, on empty stomach at fasting, with water, separated by at least 30 minutes from breakfast and other medications,  and separated by more than 4 hours from calcium, iron, multivitamins, acid reflux medications (PPIs). -Patient is made aware of the fact that thyroid hormone replacement is needed for life, dose to be adjusted by periodic monitoring of thyroid function tests.   6) multinodular goiter: Review of his ultrasound between 2017-2019 documented multinodular goiter.  He does have one of his nodules biopsied with benign outcomes.  He wishes to postpone surveillance thyroid ultrasound until after his next visit.    7) Chronic Care/Health Maintenance:  -he  is on ACEI/ARB and Statin medications and  is encouraged to initiate and continue to follow up with Ophthalmology, Dentist,  Podiatrist at least yearly or according to recommendations, and advised to   stay away from smoking. I have recommended yearly flu vaccine and pneumonia vaccine at least every 5 years; moderate intensity exercise for up to 150 minutes weekly; and  sleep for 7- 9 hours a day.  - he is  advised to  maintain close follow up with Coral Spikes, DO for primary care needs, as well as his other providers for optimal and coordinated care.   I spent  26  minutes in the care of the patient today including review of labs from [place], Lipids, Thyroid Function, Hematology (current and previous including abstractions from other facilities); face-to-face time discussing  his blood glucose readings/logs, discussing hypoglycemia and hyperglycemia episodes and symptoms, medications doses, his options of short and long term treatment based on the latest standards of care / guidelines;  discussion about incorporating lifestyle medicine;  and documenting the encounter. Risk reduction counseling performed per USPSTF guidelines to reduce  cardiovascular risk factors.     Please refer to Patient Instructions for Blood Glucose Monitoring and Insulin/Medications Dosing Guide"  in media tab for additional information. Please  also refer to " Patient Self Inventory" in the Media  tab for reviewed elements of pertinent patient history.  Grant Torres participated in the discussions, expressed understanding, and voiced agreement with the above plans.  All questions were answered to his satisfaction. he is encouraged to contact clinic should he have any questions or concerns prior to his return visit.    Follow up plan: -  Return in about 4 months (around 04/17/2023) for Bring Meter/CGM Device/Logs- A1c in Office.  Glade Lloyd, MD [facility] [address] [place], [place] [address] Phone: [phone]  Fax: [phone]    12/17/2022, 12:59 PM  This note was partially dictated with voice recognition software. Similar sounding words can be transcribed inadequately or may not  be corrected upon review.

## 2022-12-17 NOTE — Progress Notes (Signed)
Medical Nutrition Therapy  Appointment Start time: [id number]      Appointment End time:  0945  Primary concerns today: Dm Type 2  Referral diagnosis: E11.8 Preferred learning style: visual  Learning readiness: Changes in progress   NUTRITION ASSESSMENT Follow up Changes Made: A1C down to 6.5%. Saw Dr. Dorris Fetch today. Lost 7 lbs. Stopped Jardiance as of today. Feels great. Less arthritis and inflammation. Still on Metformin 500 mg a day. FBS 120's Bedtime 130-150's. Has done a great job incorporating whole plant based meals into their lifestyle. Medications.: Metformin 500 Walks some and a lot more active overall. Wt Readings from Last 3 Encounters:  12/17/22 154 lb 9.6 oz (70.1 kg)  11/05/22 161 lb (73 kg)  06/25/22 167 lb (75.8 kg)   Ht Readings from Last 3 Encounters:  12/17/22 5' 10"$  (1.778 m)  11/05/22 5' 10.5" (1.791 m)  06/25/22 5' 11.5" (1.816 m)   There is no height or weight on file to calculate BMI. @BMIFA$ @ Facility age limit for growth %iles is 20 years. Facility age limit for growth %iles is 20 years.  Lab Results  Component Value Date   HGBA1C 6.5 12/17/2022   Anthropometrics  Wt Readings from Last 3 Encounters:  12/17/22 154 lb 9.6 oz (70.1 kg)  11/05/22 161 lb (73 kg)  06/25/22 167 lb (75.8 kg)   Ht Readings from Last 3 Encounters:  12/17/22 5' 10"$  (1.778 m)  11/05/22 5' 10.5" (1.791 m)  06/25/22 5' 11.5" (1.816 m)   There is no height or weight on file to calculate BMI. @BMIFA$ @ Facility age limit for growth %iles is 20 years. Facility age limit for growth %iles is 20 years.    Clinical Medical Hx:  Dm Type 2 Medications: Metformin and Jardiance Labs: Lab Results  Component Value Date   HGBA1C 6.5 12/17/2022      Latest Ref Rng & Units 12/10/2022    8:14 AM 06/20/2022    9:40 AM 05/30/2022    9:15 AM  CMP  Glucose 70 - 99 mg/dL 170  146  186   BUN 8 - 27 mg/dL 15  16  13   $ Creatinine 0.76 - 1.27 mg/dL 0.70  0.65  0.69   Sodium 134 - 144 mmol/L  139  139  141   Potassium 3.5 - 5.2 mmol/L 4.2  4.7  4.3   Chloride 96 - 106 mmol/L 101  102  103   CO2 20 - 29 mmol/L 22  25  23   $ Calcium 8.6 - 10.2 mg/dL 9.7  9.3  9.7   Total Protein 6.0 - 8.5 g/dL 6.8  6.8  7.2   Total Bilirubin 0.0 - 1.2 mg/dL 0.6  0.7  0.6   Alkaline Phos 44 - 121 IU/L 65  57  55   AST 0 - 40 IU/L 22  20  18   $ ALT 0 - 44 IU/L 21  19  27     $ Notable Signs/Symptoms: None  Lifestyle & Dietary Hx Lives with his wife and has 2 kids.   Estimated daily fluid intake: 128 oz Supplements: Vit D Sleep: 7/8 Stress / self-care: none Current average weekly physical activity: Walking at lunch-30 mins.  24-Hr Dietary Recall First Meal:  oatmeal or grain berries with almond milk. Second Meal: Homemade plant based soup-beans, chick peas, greens, tomatoes. Water, Snack:  Third Meal: Baked flounder, salad and few potatoes, water  Snack:  Beverages: water  Estimated Energy Needs Calories: 2000-2200 Carbohydrate: 225g Protein: 150g  Fat: 58g   NUTRITION DIAGNOSIS  NB-1.1 Food and nutrition-related knowledge deficit As related to Diabetes Type 2.  As evidenced by A1c 7.3%.   NUTRITION INTERVENTION  Nutrition education (E-1) on the following topics:  Nutrition and Diabetes education provided on My Plate, CHO counting, meal planning, portion sizes, timing of meals, avoiding snacks between meals unless having a low blood sugar, target ranges for A1C and blood sugars, signs/symptoms and treatment of hyper/hypoglycemia, monitoring blood sugars, taking medications as prescribed, benefits of exercising 30 minutes per day and prevention of complications of DM. Lifestyle Medicine  - Whole Food, Plant Predominant Nutrition is highly recommended: Eat Plenty of vegetables, Mushrooms, fruits, Legumes, Whole Grains, Nuts, seeds in lieu of processed meats, processed snacks/pastries red meat, poultry, eggs.    -It is better to avoid simple carbohydrates including: Cakes, Sweet  Desserts, Ice Cream, Soda (diet and regular), Sweet Tea, Candies, Chips, Cookies, Store Bought Juices, Alcohol in Excess of  1-2 drinks a day, Lemonade,  Artificial Sweeteners, Doughnuts, Coffee Creamers, "Sugar-free" Products, etc, etc.  This is not a complete list.....  Exercise: If you are able: 30 -60 minutes a day ,4 days a week, or 150 minutes a week.  The longer the better.  Combine stretch, strength, and aerobic activities.  If you were told in the past that you have high risk for cardiovascular diseases, you may seek evaluation by your heart doctor prior to initiating moderate to intense exercise programs.   Handouts Provided Include  Meal Plan Card Food label reading  Learning Style & Readiness for Change Teaching method utilized: Visual & Auditory  Demonstrated degree of understanding via: Teach Back  Barriers to learning/adherence to lifestyle change: none   Goals Established by Pt Goals  Keep up the great job. Consider recipes of [employer] over [employer] book at [employer]. Get A1C 5.7% or less.    MONITORING & EVALUATION Dietary intake, weekly physical activity, and blood sugars in 4 months.  Next Steps  Patient is to keep focusing on lifestyle medicine.

## 2022-12-21 ENCOUNTER — Ambulatory Visit: Payer: BC Managed Care – PPO | Admitting: Family Medicine

## 2022-12-26 ENCOUNTER — Ambulatory Visit: Payer: BC Managed Care – PPO | Admitting: Family Medicine

## 2022-12-26 VITALS — BP 120/72 | HR 78 | Temp 97.5°F | Wt 159.0 lb

## 2022-12-26 DIAGNOSIS — I10 Essential (primary) hypertension: Secondary | ICD-10-CM | POA: Diagnosis not present

## 2022-12-26 DIAGNOSIS — E782 Mixed hyperlipidemia: Secondary | ICD-10-CM | POA: Diagnosis not present

## 2022-12-26 DIAGNOSIS — E119 Type 2 diabetes mellitus without complications: Secondary | ICD-10-CM

## 2022-12-26 MED ORDER — ATORVASTATIN CALCIUM 40 MG PO TABS: ORAL_TABLET | ORAL | 1 refills | Status: DC

## 2022-12-26 MED ORDER — ATORVASTATIN CALCIUM 40 MG PO TABS: 40.0000 mg | ORAL_TABLET | Freq: Every day | ORAL | 1 refills | Status: DC

## 2022-12-26 MED ORDER — ENALAPRIL MALEATE 20 MG PO TABS: ORAL_TABLET | ORAL | 1 refills | Status: DC

## 2022-12-26 NOTE — Assessment & Plan Note (Signed)
Well-controlled.  Continue Lipitor.

## 2022-12-26 NOTE — Progress Notes (Signed)
Subjective:  Patient ID: Grant Torres, male    DOB: 12-24-1960  Age: 62 y.o. MRN: [id number]  CC: Chief Complaint  Patient presents with   Hypertension   Hyperlipidemia    HPI:  62 year old male with hypertension, hypothyroidism, type 2 diabetes, hyperlipidemia presents for follow-up.  Patient's A1c is at goal.  He has dramatically changed his lifestyle and is on a vegan diet.  He remains on metformin.  Jardiance was recently discontinued.  Needs urine microalbumin.  Lipids well-controlled on Lipitor and via dietary changes.  Hypertension well-controlled on enalapril.  Patient Active Problem List   Diagnosis Date Noted   Essential hypertension 12/21/2021   Hypothyroidism 08/18/2019   Multinodular goiter 08/11/2018   Mixed hyperlipidemia 09/01/2013   Diabetes ([place]) 09/01/2013    Social Hx   Social History   Socioeconomic History   Marital status: Married    Spouse name: Not on file   Number of children: Not on file   Years of education: Not on file   Highest education level: Not on file  Occupational History   Not on file  Tobacco Use   Smoking status: Never   Smokeless tobacco: Never  Vaping Use   Vaping Use: Never used  Substance and Sexual Activity   Alcohol use: Not on file    Comment: occasionally   Drug use: Never   Sexual activity: Not on file  Other Topics Concern   Not on file  Social History Narrative   Not on file   Social Determinants of Health   Financial Resource Strain: Not on file  Food Insecurity: Not on file  Transportation Needs: Not on file  Physical Activity: Not on file  Stress: Not on file  Social Connections: Not on file    Review of Systems  Constitutional: Negative.   Respiratory: Negative.    Cardiovascular: Negative.    Objective:  BP 120/72   Pulse 78   Temp (!) 97.5 F (36.4 C)   Wt 159 lb (72.1 kg)   SpO2 99%   BMI 22.81 kg/m      12/26/2022    8:40 AM 12/17/2022    8:52 AM 11/05/2022    8:26 AM   BP/Weight  Systolic BP [id number]   Diastolic BP 72 64   Wt. (Lbs) 159 154.6 161  BMI 22.81 kg/m2 22.18 kg/m2 22.77 kg/m2    Physical Exam Vitals and nursing note reviewed.  Constitutional:      General: He is not in acute distress.    Appearance: Normal appearance.  HENT:     Head: Normocephalic and atraumatic.  Eyes:     General:        Right eye: No discharge.        Left eye: No discharge.     Conjunctiva/sclera: Conjunctivae normal.  Cardiovascular:     Rate and Rhythm: Normal rate and regular rhythm.  Pulmonary:     Effort: Pulmonary effort is normal.     Breath sounds: Normal breath sounds. No wheezing, rhonchi or rales.  Neurological:     Mental Status: He is alert.  Psychiatric:        Mood and Affect: Mood normal.        Behavior: Behavior normal.     Lab Results  Component Value Date   WBC 4.9 01/30/2021   HGB 15.5 01/30/2021   HCT 45.8 01/30/2021   PLT 193 01/30/2021   GLUCOSE 170 (H) 12/10/2022   CHOL 107 05/30/2022  TRIG 56 05/30/2022   HDL 46 05/30/2022   LDLCALC 48 05/30/2022   ALT 21 12/10/2022   AST 22 12/10/2022   NA 139 12/10/2022   K 4.2 12/10/2022   CL 101 12/10/2022   CREATININE 0.70 (L) 12/10/2022   BUN 15 12/10/2022   CO2 22 12/10/2022   TSH 4.130 05/30/2022   PSA 2.23 10/20/2014   HGBA1C 6.5 12/17/2022   MICROALBUR 3.6 (H) 10/20/2014     Assessment & Plan:   Problem List Items Addressed This Visit       Cardiovascular and Mediastinum   Essential hypertension    Well-controlled.  Continue enalapril.  May be able to decrease dosing and possibly discontinue in the future given patient's traumatic lifestyle changes.      Relevant Medications   atorvastatin (LIPITOR) 40 MG tablet   enalapril (VASOTEC) 20 MG tablet     Endocrine   Diabetes ([place]) - Primary    A1c at goal.  Patient will continue metformin at this time.  Urine microalbumin today.      Relevant Medications   atorvastatin (LIPITOR) 40 MG tablet   enalapril  (VASOTEC) 20 MG tablet   Other Relevant Orders   Microalbumin / creatinine urine ratio     Other   Mixed hyperlipidemia    Well-controlled.  Continue Lipitor.      Relevant Medications   atorvastatin (LIPITOR) 40 MG tablet   enalapril (VASOTEC) 20 MG tablet    Meds ordered this encounter  Medications   atorvastatin (LIPITOR) 40 MG tablet    Sig: Take 1 tablet (40 mg total) by mouth at bedtime. Take 1/2 tab p.o. daily.    Dispense:  90 tablet    Refill:  1   enalapril (VASOTEC) 20 MG tablet    Sig: Take 1 tablet by mouth once daily    Dispense:  90 tablet    Refill:  1    Follow-up:  Return in about 6 months (around 06/26/2023).  [place]

## 2022-12-26 NOTE — Patient Instructions (Signed)
Continue your medications.  Follow up in 6 months.  Take care  Dr. Lacinda Axon

## 2022-12-26 NOTE — Addendum Note (Signed)
Addended by: Coral Spikes on: 12/26/2022 09:13 AM   Modules accepted: Orders

## 2022-12-26 NOTE — Assessment & Plan Note (Signed)
A1c at goal.  Patient will continue metformin at this time.  Urine microalbumin today.

## 2022-12-26 NOTE — Assessment & Plan Note (Signed)
Well-controlled.  Continue enalapril.  May be able to decrease dosing and possibly discontinue in the future given patient's traumatic lifestyle changes.

## 2022-12-27 LAB — MICROALBUMIN / CREATININE URINE RATIO
Creatinine, Urine: 77.7 mg/dL
Microalb/Creat Ratio: 14 mg/g creat (ref 0–29)
Microalbumin, Urine: 10.9 ug/mL

## 2023-02-19 ENCOUNTER — Ambulatory Visit: Payer: BC Managed Care – PPO | Admitting: Nutrition

## 2023-02-26 ENCOUNTER — Other Ambulatory Visit: Payer: Self-pay | Admitting: "Endocrinology

## 2023-02-26 DIAGNOSIS — E038 Other specified hypothyroidism: Secondary | ICD-10-CM

## 2023-03-07 ENCOUNTER — Encounter (INDEPENDENT_AMBULATORY_CARE_PROVIDER_SITE_OTHER): Payer: Self-pay | Admitting: *Deleted

## 2023-04-18 ENCOUNTER — Ambulatory Visit (INDEPENDENT_AMBULATORY_CARE_PROVIDER_SITE_OTHER): Payer: BC Managed Care – PPO | Admitting: "Endocrinology

## 2023-04-18 ENCOUNTER — Ambulatory Visit: Payer: BC Managed Care – PPO | Admitting: Nutrition

## 2023-04-18 ENCOUNTER — Encounter: Payer: Self-pay | Admitting: "Endocrinology

## 2023-04-18 VITALS — BP 96/60 | HR 76 | Ht 70.0 in | Wt 163.2 lb

## 2023-04-18 DIAGNOSIS — E119 Type 2 diabetes mellitus without complications: Secondary | ICD-10-CM

## 2023-04-18 DIAGNOSIS — E038 Other specified hypothyroidism: Secondary | ICD-10-CM

## 2023-04-18 DIAGNOSIS — Z7984 Long term (current) use of oral hypoglycemic drugs: Secondary | ICD-10-CM | POA: Insufficient documentation

## 2023-04-18 DIAGNOSIS — I10 Essential (primary) hypertension: Secondary | ICD-10-CM | POA: Diagnosis not present

## 2023-04-18 DIAGNOSIS — E782 Mixed hyperlipidemia: Secondary | ICD-10-CM | POA: Diagnosis not present

## 2023-04-18 LAB — POCT GLYCOSYLATED HEMOGLOBIN (HGB A1C): HbA1c, POC (controlled diabetic range): 7.8 % — AB (ref 0.0–7.0)

## 2023-04-18 MED ORDER — ATORVASTATIN CALCIUM 10 MG PO TABS: ORAL_TABLET | ORAL | 1 refills | Status: DC

## 2023-04-18 MED ORDER — ENALAPRIL MALEATE 10 MG PO TABS: ORAL_TABLET | ORAL | 1 refills | Status: AC

## 2023-04-18 MED ORDER — SILDENAFIL CITRATE 25 MG PO TABS: 25.0000 mg | ORAL_TABLET | Freq: Every day | ORAL | 3 refills | Status: DC | PRN

## 2023-04-18 MED ORDER — EMPAGLIFLOZIN 10 MG PO TABS: 10.0000 mg | ORAL_TABLET | Freq: Every day | ORAL | 1 refills | Status: DC

## 2023-04-18 NOTE — Progress Notes (Signed)
04/18/2023, 4:47 PM  Endocrinology follow-up note   Subjective:    Patient ID: Grant Torres, male    DOB: 1961-05-21.  Garald Balding is being seen in follow-up after he was seen in consultation for management of currently uncontrolled symptomatic diabetes requested by  Tommie Sams, DO.   Past Medical History:  Diagnosis Date   Diabetes mellitus without complication (HCC)    Hyperlipidemia    Hypertension    Microproteinuria     Past Surgical History:  Procedure Laterality Date   CERVICAL SPINE SURGERY  11/25/2015   COLONOSCOPY N/A 04/10/2018   Procedure: COLONOSCOPY;  Surgeon: Malissa Hippo, MD;  Location: [facility];  Service: Endoscopy;  Laterality: N/A;  200   POLYPECTOMY  04/10/2018   Procedure: POLYPECTOMY;  Surgeon: Malissa Hippo, MD;  Location: [facility];  Service: Endoscopy;;  colon   VASECTOMY     WISDOM TOOTH EXTRACTION      Social History   Socioeconomic History   Marital status: Married    Spouse name: Not on file   Number of children: Not on file   Years of education: Not on file   Highest education level: Not on file  Occupational History   Not on file  Tobacco Use   Smoking status: Never   Smokeless tobacco: Never  Vaping Use   Vaping Use: Never used  Substance and Sexual Activity   Alcohol use: Not on file    Comment: occasionally   Drug use: Never   Sexual activity: Not on file  Other Topics Concern   Not on file  Social History Narrative   Not on file   Social Determinants of Health   Financial Resource Strain: Not on file  Food Insecurity: Not on file  Transportation Needs: Not on file  Physical Activity: Not on file  Stress: Not on file  Social Connections: Not on file    Family History  Problem Relation Age of Onset   Kidney disease Mother    Diabetes Mother    Heart attack Mother    Stroke Mother    Heart failure Mother     Heart attack Father    Hypertension Sister    Colon cancer Neg Hx    Thyroid disease Neg Hx     Outpatient Encounter Medications as of 04/18/2023  Medication Sig   empagliflozin (JARDIANCE) 10 MG TABS tablet Take 1 tablet (10 mg total) by mouth daily before breakfast.   sildenafil (VIAGRA) 25 MG tablet Take 1 tablet (25 mg total) by mouth daily as needed for erectile dysfunction.   ACCU-CHEK GUIDE test strip USE 1 TWICE DAILY TO CHECK BLOOD GLUCOSE   Accu-Chek Softclix Lancets lancets USE 1  TO CHECK GLUCOSE TWICE DAILY   Accu-Chek Softclix Lancets lancets USE 1 LANCET TO CHECK GLUCOSE TWICE DAILY   aspirin EC 81 MG tablet Take 81 mg by mouth daily.   atorvastatin (LIPITOR) 10 MG tablet Take 1/2 tab p.o. daily.   blood glucose meter kit and supplies KIT Dispense based on patient and insurance preference. Use to test  blood sugar twice daily. ICD 10 code E11.9   Blood Glucose Monitoring Suppl (ONETOUCH VERIO) w/Device KIT TEST ONCE DAILY   enalapril (VASOTEC) 10 MG tablet Take 1 tablet by mouth once daily   levothyroxine (SYNTHROID) 50 MCG tablet TAKE 1 TABLET BY MOUTH BEFORE BREAKFAST   metFORMIN (GLUCOPHAGE-XR) 500 MG 24 hr tablet Take 1 tablet (500 mg total) by mouth 2 (two) times daily after a meal.   [DISCONTINUED] atorvastatin (LIPITOR) 40 MG tablet Take 1/2 tab p.o. daily.   [DISCONTINUED] enalapril (VASOTEC) 20 MG tablet Take 1 tablet by mouth once daily   No facility-administered encounter medications on file as of 04/18/2023.    ALLERGIES: No Known Allergies  VACCINATION STATUS: Immunization History  Administered Date(s) Administered   Influenza, Quadrivalent, Recombinant, Inj, Pf 08/04/2019   Influenza,inj,Quad PF,6+ Mos 10/26/2015, 08/15/2018   Influenza-Unspecified 08/05/2013, 07/26/2016, 07/22/2020, 10/10/2021, 11/24/2022   PFIZER(Purple Top)SARS-COV-2 Vaccination 01/03/2020, 01/24/2020   Pneumococcal-Unspecified 11/13/2002   Tdap 11/26/2018    Diabetes He presents  for his follow-up diabetic visit. He has type 2 diabetes mellitus. Onset time: He was diagnosed at approximate age of 62 years. His disease course has been worsening. There are no hypoglycemic associated symptoms. Pertinent negatives for hypoglycemia include no confusion, headaches, pallor or seizures. There are no diabetic associated symptoms. Pertinent negatives for diabetes include no chest pain, no fatigue, no polydipsia, no polyphagia, no polyuria and no weakness. There are no hypoglycemic complications. Symptoms are worsening. Diabetic complications include impotence. Risk factors for coronary artery disease include diabetes mellitus, dyslipidemia, hypertension, male sex and family history. Current diabetic treatments: He is currently on metformin 500 mg p.o. twice daily, Jardiance 25 mg p.o. once a day. His weight is fluctuating minimally (Patient reports that historically he weighed as high as 211 pounds, currently 154 pounds.). He is following a generally unhealthy diet. When asked about meal planning, he reported none. He has not had a previous visit with a dietitian. He participates in exercise intermittently. His home blood glucose trend is increasing steadily. His breakfast blood glucose range is generally 180-200 mg/dl. His lunch blood glucose range is generally 180-200 mg/dl. His dinner blood glucose range is generally 180-200 mg/dl. His bedtime blood glucose range is generally 180-200 mg/dl. His overall blood glucose range is 180-200 mg/dl. (He presents with higher glycemic profile and point-of-care A1c was 7.8% increasing from 6.5% during his last visit.  He does not report hypoglycemia.    ) An ACE inhibitor/angiotensin II receptor blocker is being taken.  Hyperlipidemia This is a chronic problem. The current episode started more than 1 year ago. The problem is controlled. Exacerbating diseases include diabetes. Pertinent negatives include no chest pain, myalgias or shortness of breath. Risk  factors for coronary artery disease include diabetes mellitus, dyslipidemia, family history, hypertension and male sex.  Hypertension This is a chronic problem. The current episode started more than 1 year ago. Pertinent negatives include no chest pain, headaches, neck pain, palpitations or shortness of breath. Risk factors for coronary artery disease include dyslipidemia, diabetes mellitus and family history. Past treatments include ACE inhibitors. Identifiable causes of hypertension include a thyroid problem.  Thyroid Problem Presents for initial visit. Onset time: Patient is known to have multinodular goiter between 2017-2019 and thyroid sonograms.  He also has multiple nodules in his thyroid.  1 nodule was biopsied with benign outcomes. Patient reports no constipation, diarrhea, fatigue or palpitations. Past treatments include levothyroxine. Prior procedures include thyroid FNA. The following procedures have not been performed: radioiodine  uptake scan and thyroidectomy. His past medical history is significant for diabetes and hyperlipidemia.     Review of Systems  Constitutional:  Negative for chills, fatigue, fever and unexpected weight change.  HENT:  Negative for dental problem, mouth sores and trouble swallowing.   Eyes:  Negative for visual disturbance.  Respiratory:  Negative for cough, choking, chest tightness, shortness of breath and wheezing.   Cardiovascular:  Negative for chest pain, palpitations and leg swelling.  Gastrointestinal:  Negative for abdominal distention, abdominal pain, constipation, diarrhea, nausea and vomiting.  Endocrine: Negative for polydipsia, polyphagia and polyuria.  Genitourinary:  Positive for impotence. Negative for dysuria, flank pain, hematuria and urgency.  Musculoskeletal:  Negative for back pain, gait problem, myalgias and neck pain.  Skin:  Negative for pallor, rash and wound.  Neurological:  Negative for seizures, syncope, weakness, numbness and  headaches.  Psychiatric/Behavioral:  Negative for confusion and dysphoric mood.     Objective:       04/18/2023    8:51 AM 12/26/2022    8:40 AM 12/17/2022    8:52 AM  Vitals with BMI  Height 5\' 10"   5\' 10"   Weight 163 lbs 3 oz 159 lbs 154 lbs 10 oz  BMI 23.42 22.81 22.18  Systolic 96 120 106  Diastolic 60 72 64  Pulse 76 78 68    BP 96/60   Pulse 76   Ht 5\' 10"  (1.778 m)   Wt 163 lb 3.2 oz (74 kg)   BMI 23.42 kg/m   Wt Readings from Last 3 Encounters:  04/18/23 163 lb 3.2 oz (74 kg)  12/26/22 159 lb (72.1 kg)  12/17/22 154 lb 9.6 oz (70.1 kg)      CMP ( most recent) CMP     Component Value Date/Time   NA 139 12/10/2022 0814   K 4.2 12/10/2022 0814   CL 101 12/10/2022 0814   CO2 22 12/10/2022 0814   GLUCOSE 170 (H) 12/10/2022 0814   GLUCOSE 224 (H) 10/20/2014 0937   BUN 15 12/10/2022 0814   CREATININE 0.70 (L) 12/10/2022 0814   CREATININE 0.77 10/20/2014 0937   CALCIUM 9.7 12/10/2022 0814   PROT 6.8 12/10/2022 0814   ALBUMIN 4.6 12/10/2022 0814   AST 22 12/10/2022 0814   ALT 21 12/10/2022 0814   ALKPHOS 65 12/10/2022 0814   BILITOT 0.6 12/10/2022 0814   GFRNONAA 103 08/01/2020 0904   GFRAA 120 08/01/2020 0904     Diabetic Labs (most recent): Lab Results  Component Value Date   HGBA1C 7.8 (A) 04/18/2023   HGBA1C 6.5 12/17/2022   HGBA1C 7.3 (A) 05/30/2022   MICROALBUR 3.6 (H) 10/20/2014   MICROALBUR 1.99 (H) 09/01/2013     Lipid Panel ( most recent) Lipid Panel     Component Value Date/Time   CHOL 107 05/30/2022 0915   TRIG 56 05/30/2022 0915   HDL 46 05/30/2022 0915   CHOLHDL 2.3 05/30/2022 0915   CHOLHDL 2.9 10/20/2014 0937   VLDL 20 10/20/2014 0937   LDLCALC 48 05/30/2022 0915   LABVLDL 13 05/30/2022 0915      Lab Results  Component Value Date   TSH 4.130 05/30/2022   TSH 2.22 08/16/2021   TSH 2.66 08/16/2020   TSH 2.710 09/18/2019   TSH 2.76 08/11/2018   TSH 3.880 01/13/2016   FREET4 1.39 05/30/2022   FREET4 1.10 08/16/2021    FREET4 1.14 08/16/2020   FREET4 1.65 09/18/2019   FREET4 1.05 08/11/2018  Assessment & Plan:   1. Type 2 diabetes mellitus without complication, without long-term current use of insulin (HCC)  - [place] DIRKSEN has currently uncontrolled symptomatic type 2 DM since  62 years of age.   He presents with higher glycemic profile and point-of-care A1c was 7.8% increasing from 6.5% during his last visit.  He does not report hypoglycemia.      He presents with significant improvement in his glycemic profile.  Recent labs reviewed. - I had a long discussion with him about the progressive nature of diabetes and the pathology behind its complications.  -He does not report any gross complications from his diabetes, however he has comorbid conditions of hyperlipidemia, hypertension, and patient remains at a high risk for more acute and chronic complications which include CAD, CVA, CKD, retinopathy, and neuropathy. These are all discussed in detail with him.  - he acknowledges that there is a room for improvement in his food and drink choices. - Suggestion is made for him to avoid simple carbohydrates  from his diet including Cakes, Sweet Desserts, Ice Cream, Soda (diet and regular), Sweet Tea, Candies, Chips, Cookies, Store Bought Juices, Alcohol , Artificial Sweeteners,  Coffee Creamer, and "Sugar-free" Products, Lemonade. This will help patient to have more stable blood glucose profile and potentially avoid unintended weight gain.  The following Lifestyle Medicine recommendations according to American College of Lifestyle Medicine  [facility]) were discussed and and offered to patient and he  agrees to start the journey:  A. Whole Foods, Plant-Based Nutrition comprising of fruits and vegetables, plant-based proteins, whole-grain carbohydrates was discussed in detail with the patient.   A list for source of those nutrients were also provided to the patient.  Patient will use only water or  unsweetened tea for hydration. B.  The need to stay away from risky substances including alcohol, smoking; obtaining 7 to 9 hours of restorative sleep, at least 150 minutes of moderate intensity exercise weekly, the importance of healthy social connections,  and stress management techniques were discussed. C.  A full color page of  Calorie density of various food groups per pound showing examples of each food groups was provided to the patient.   - he is scheduled with Norm Salt, RDN, CDE for individualized diabetes education.  - I have approached him with the following plan to manage  his diabetes and patient agrees:   -He presents with slight loss of control in his glycemic profile, would benefit from resumption of Jardiance.  I discussed and resume his Jardiance 10 mg p.o. daily at breakfast.  He is also advised to continue metformin 500 g p.o. twice daily.       He is advised to continue monitoring blood glucose twice a day-daily before breakfast and at bedtime.   - he is encouraged to call clinic for blood glucose levels less than 70 or above 200 mg /dl.  - Specific targets for  A1c;  LDL, HDL,  and Triglycerides were discussed with the patient.  2) Blood Pressure /Hypertension:  His blood pressure is marginally controlled.  He is advised to lower his enalapril to 10 mg p.o. daily at breakfast   3) Lipids/Hyperlipidemia:   Review of his recent lipid panel showed  controlled  LDL at 48 .  he  is advised to continue lower dose of his lovastatin to 10 mg p.o. daily at bedtime.    Side effects and precautions discussed with him.  4)  Weight/Diet:  Body mass index is  23.42 kg/m.  -    he is not a candidate for weight loss. I discussed with him the fact that loss of 5 - 10% of his  current body weight will have the most impact on his diabetes management.  The above detailed  ACLM recommendations for nutrition, exercise, sleep, social life, avoidance of risky substances, the need for  restorative sleep   information will also detailed on discharge instructions.   5) hypothyroidism: His thyroid function tests are within appropriate replacement.  He is advised to continue levothyroxine 50 mcg p.o. daily before breakfast.    - We discussed about the correct intake of his thyroid hormone, on empty stomach at fasting, with water, separated by at least 30 minutes from breakfast and other medications,  and separated by more than 4 hours from calcium, iron, multivitamins, acid reflux medications (PPIs). -Patient is made aware of the fact that thyroid hormone replacement is needed for life, dose to be adjusted by periodic monitoring of thyroid function tests.    6) multinodular goiter: Review of his ultrasound between 2017-2019 documented multinodular goiter.  He does have one of his nodules biopsied with benign outcomes.  He wishes to postpone surveillance thyroid ultrasound until after his next visit.    7) Chronic Care/Health Maintenance:  -he  is on ACEI/ARB and Statin medications and  is encouraged to initiate and continue to follow up with Ophthalmology, Dentist,  Podiatrist at least yearly or according to recommendations, and advised to   stay away from smoking. I have recommended yearly flu vaccine and pneumonia vaccine at least every 5 years; moderate intensity exercise for up to 150 minutes weekly; and  sleep for 7- 9 hours a day.  Regarding his concern the ED : He may benefit from low-dose  I discussed and prescribed low-dose Viagra  25 mg p.o. as needed.  - he is  advised to maintain close follow up with Tommie Sams, DO for primary care needs, as well as his other providers for optimal and coordinated care.   I spent  26  minutes in the care of the patient today including review of labs from CMP, Lipids, Thyroid Function, Hematology (current and previous including abstractions from other facilities); face-to-face time discussing  his blood glucose readings/logs,  discussing hypoglycemia and hyperglycemia episodes and symptoms, medications doses, his options of short and long term treatment based on the latest standards of care / guidelines;  discussion about incorporating lifestyle medicine;  and documenting the encounter. Risk reduction counseling performed per USPSTF guidelines to reduce cardiovascular risk factors.     Please refer to Patient Instructions for Blood Glucose Monitoring and Insulin/Medications Dosing Guide"  in media tab for additional information. Please  also refer to " Patient Self Inventory" in the Media  tab for reviewed elements of pertinent patient history.  Garald Balding participated in the discussions, expressed understanding, and voiced agreement with the above plans.  All questions were answered to his satisfaction. he is encouraged to contact clinic should he have any questions or concerns prior to his return visit.     Follow up plan: - Return in about 4 months (around 08/18/2023) for F/U with Pre-visit Labs, Meter/CGM/Logs, A1c here.  Marquis Lunch, MD [facility] [address] [place], [place] [address] Phone: [phone]  Fax: [phone]    04/18/2023, 4:47 PM  This note was partially dictated with voice recognition software. Similar sounding words can be transcribed inadequately or may not  be corrected upon review.

## 2023-04-18 NOTE — Patient Instructions (Signed)
                                     Advice for Weight Management  -For most of us the best way to lose weight is by diet management. Generally speaking, diet management means consuming less calories intentionally which over time brings about progressive weight loss.  This can be achieved more effectively by avoiding ultra processed carbohydrates, processed meats, unhealthy fats.    It is critically important to know your numbers: how much calorie you are consuming and how much calorie you need. More importantly, our carbohydrates sources should be unprocessed naturally occurring  complex starch food items.  It is always important to balance nutrition also by  appropriate intake of proteins (mainly plant-based), healthy fats/oils, plenty of fruits and vegetables.   -The American College of Lifestyle Medicine (ACL M) recommends nutrition derived mostly from Whole Food, Plant Predominant Sources example an apple instead of applesauce or apple pie. Eat Plenty of vegetables, Mushrooms, fruits, Legumes, Whole Grains, Nuts, seeds in lieu of processed meats, processed snacks/pastries red meat, poultry, eggs.  Use only water or unsweetened tea for hydration.  The College also recommends the need to stay away from risky substances including alcohol, smoking; obtaining 7-9 hours of restorative sleep, at least 150 minutes of moderate intensity exercise weekly, importance of healthy social connections, and being mindful of stress and seek help when it is overwhelming.    -Sticking to a routine mealtime to eat 3 meals a day and avoiding unnecessary snacks is shown to have a big role in weight control. Under normal circumstances, the only time we burn stored energy is when we are hungry, so allow  some hunger to take place- hunger means no food between appropriate meal times, only water.  It is not advisable to starve.   -It is better to avoid simple carbohydrates including:  Cakes, Sweet Desserts, Ice Cream, Soda (diet and regular), Sweet Tea, Candies, Chips, Cookies, Store Bought Juices, Alcohol in Excess of  1-2 drinks a day, Lemonade,  Artificial Sweeteners, Doughnuts, Coffee Creamers, "Sugar-free" Products, etc, etc.  This is not a complete list.....    -Consulting with certified diabetes educators is proven to provide you with the most accurate and current information on diet.  Also, you may be  interested in discussing diet options/exchanges , we can schedule a visit with Grant Torres, RDN, CDE for individualized nutrition education.  -Exercise: If you are able: 30 -60 minutes a day ,4 days a week, or 150 minutes of moderate intensity exercise weekly.    The longer the better if tolerated.  Combine stretch, strength, and aerobic activities.  If you were told in the past that you have high risk for cardiovascular diseases, or if you are currently symptomatic, you may seek evaluation by your heart doctor prior to initiating moderate to intense exercise programs.                                  Additional Care Considerations for Diabetes/Prediabetes   -Diabetes  is a chronic disease.  The most important care consideration is regular follow-up with your diabetes care provider with the goal being avoiding or delaying its complications and to take advantage of advances in medications and technology.  If appropriate actions are taken early enough, type 2 diabetes can even be   reversed.  Seek information from the right source.  - Whole Food, Plant Predominant Nutrition is highly recommended: Eat Plenty of vegetables, Mushrooms, fruits, Legumes, Whole Grains, Nuts, seeds in lieu of processed meats, processed snacks/pastries red meat, poultry, eggs as recommended by American College of  Lifestyle Medicine (ACLM).  -Type 2 diabetes is known to coexist with other important comorbidities such as high blood pressure and high cholesterol.  It is critical to control not only the  diabetes but also the high blood pressure and high cholesterol to minimize and delay the risk of complications including coronary artery disease, stroke, amputations, blindness, etc.  The good news is that this diet recommendation for type 2 diabetes is also very helpful for managing high cholesterol and high blood blood pressure.  - Studies showed that people with diabetes will benefit from a class of medications known as ACE inhibitors and statins.  Unless there are specific reasons not to be on these medications, the standard of care is to consider getting one from these groups of medications at an optimal doses.  These medications are generally considered safe and proven to help protect the heart and the kidneys.    - People with diabetes are encouraged to initiate and maintain regular follow-up with eye doctors, foot doctors, dentists , and if necessary heart and kidney doctors.     - It is highly recommended that people with diabetes quit smoking or stay away from smoking, and get yearly  flu vaccine and pneumonia vaccine at least every 5 years.  See above for additional recommendations on exercise, sleep, stress management , and healthy social connections.      

## 2023-04-23 DIAGNOSIS — R42 Dizziness and giddiness: Secondary | ICD-10-CM | POA: Insufficient documentation

## 2023-05-10 ENCOUNTER — Other Ambulatory Visit: Payer: Self-pay | Admitting: "Endocrinology

## 2023-05-25 ENCOUNTER — Other Ambulatory Visit: Payer: Self-pay | Admitting: "Endocrinology

## 2023-05-25 DIAGNOSIS — E038 Other specified hypothyroidism: Secondary | ICD-10-CM

## 2023-06-26 ENCOUNTER — Telehealth: Payer: Self-pay | Admitting: Family Medicine

## 2023-06-26 ENCOUNTER — Ambulatory Visit (INDEPENDENT_AMBULATORY_CARE_PROVIDER_SITE_OTHER): Payer: BC Managed Care – PPO | Admitting: Family Medicine

## 2023-06-26 ENCOUNTER — Ambulatory Visit: Payer: BC Managed Care – PPO | Admitting: Family Medicine

## 2023-06-26 VITALS — BP 125/75 | HR 79 | Temp 98.1°F | Ht 70.0 in | Wt 163.8 lb

## 2023-06-26 DIAGNOSIS — E782 Mixed hyperlipidemia: Secondary | ICD-10-CM

## 2023-06-26 DIAGNOSIS — I10 Essential (primary) hypertension: Secondary | ICD-10-CM

## 2023-06-26 DIAGNOSIS — Z7984 Long term (current) use of oral hypoglycemic drugs: Secondary | ICD-10-CM

## 2023-06-26 DIAGNOSIS — E119 Type 2 diabetes mellitus without complications: Secondary | ICD-10-CM | POA: Diagnosis not present

## 2023-06-26 MED ORDER — METFORMIN HCL ER 500 MG PO TB24: 500.0000 mg | ORAL_TABLET | Freq: Two times a day (BID) | ORAL | 1 refills | Status: DC

## 2023-06-26 MED ORDER — SILDENAFIL CITRATE 25 MG PO TABS: 25.0000 mg | ORAL_TABLET | Freq: Every day | ORAL | 3 refills | Status: AC | PRN

## 2023-06-26 NOTE — Progress Notes (Signed)
Subjective:  Patient ID: Grant Torres, male    DOB: 28-Aug-1961  Age: 62 y.o. MRN: [id number]  CC:  Follow up  HPI:  62 year old male with hypertension, multinodular goiter, hypothyroidism, diabetes, hyperlipidemia presents for follow.  Recently fell off in regards to his diet. A1c rose to 7.8. Was placed back on Jardiance by Endo. He reports that he is back on track. Monitor blood sugars.  Lipids have been well controlled. Needs lipid panel. He states that Endo is ordering blood work. Is currently take 1/4 of Lipitor 10 mg tablet.   Hypertension is well controlled. He is currently taking 1/2 of 10 mg Enalapril.   Patient Active Problem List   Diagnosis Date Noted   Essential hypertension, benign 12/21/2021   Hypothyroidism 08/18/2019   Multinodular goiter 08/11/2018   Mixed hyperlipidemia 09/01/2013   Diabetes (HCC) 09/01/2013    Social Hx   Social History   Socioeconomic History   Marital status: Married    Spouse name: Not on file   Number of children: Not on file   Years of education: Not on file   Highest education level: Not on file  Occupational History   Not on file  Tobacco Use   Smoking status: Never   Smokeless tobacco: Never  Vaping Use   Vaping status: Never Used  Substance and Sexual Activity   Alcohol use: Not on file    Comment: occasionally   Drug use: Never   Sexual activity: Not on file  Other Topics Concern   Not on file  Social History Narrative   Not on file   Social Determinants of Health   Financial Resource Strain: Not on file  Food Insecurity: Not on file  Transportation Needs: Not on file  Physical Activity: Not on file  Stress: Not on file  Social Connections: Not on file    Review of Systems  Respiratory: Negative.    Cardiovascular: Negative.    Objective:  BP 125/75   Pulse 79   Temp 98.1 F (36.7 C)   Ht 5\' 10"  (1.778 m)   Wt 163 lb 12.8 oz (74.3 kg)   SpO2 98%   BMI 23.50 kg/m      06/26/2023    8:22 AM  04/18/2023    8:51 AM 12/26/2022    8:40 AM  BP/Weight  Systolic BP 125 96 120  Diastolic BP 75 60 72  Wt. (Lbs) 163.8 163.2 159  BMI 23.5 kg/m2 23.42 kg/m2 22.81 kg/m2    Physical Exam Vitals and nursing note reviewed.  Constitutional:      General: He is not in acute distress.    Appearance: Normal appearance.  HENT:     Head: Normocephalic and atraumatic.  Eyes:     General:        Right eye: No discharge.        Left eye: No discharge.     Conjunctiva/sclera: Conjunctivae normal.  Cardiovascular:     Rate and Rhythm: Normal rate and regular rhythm.  Pulmonary:     Effort: Pulmonary effort is normal.     Breath sounds: Normal breath sounds. No wheezing, rhonchi or rales.  Neurological:     Mental Status: He is alert.  Psychiatric:        Mood and Affect: Mood normal.        Behavior: Behavior normal.     Lab Results  Component Value Date   WBC 4.9 01/30/2021   HGB 15.5 01/30/2021   HCT  45.8 01/30/2021   PLT 193 01/30/2021   GLUCOSE 170 (H) 12/10/2022   CHOL 107 05/30/2022   TRIG 56 05/30/2022   HDL 46 05/30/2022   LDLCALC 48 05/30/2022   ALT 21 12/10/2022   AST 22 12/10/2022   NA 139 12/10/2022   K 4.2 12/10/2022   CL 101 12/10/2022   CREATININE 0.70 (L) 12/10/2022   BUN 15 12/10/2022   CO2 22 12/10/2022   TSH 4.130 05/30/2022   PSA 2.23 10/20/2014   HGBA1C 7.8 (A) 04/18/2023   MICROALBUR 3.6 (H) 10/20/2014     Assessment & Plan:   Problem List Items Addressed This Visit       Cardiovascular and Mediastinum   Essential hypertension, benign    Stable on enalapril.  Continue.      Relevant Medications   sildenafil (VIAGRA) 25 MG tablet     Endocrine   Diabetes (HCC) - Primary    Recently been uncontrolled.  Patient is back on track regarding his diet.  Continue metformin and Jardiance.      Relevant Medications   metFORMIN (GLUCOPHAGE-XR) 500 MG 24 hr tablet     Other   Mixed hyperlipidemia    Has been well-controlled on Lipitor.   However, the dose has been decreased.  Awaiting lipid panel in regards to dose titration.      Relevant Medications   sildenafil (VIAGRA) 25 MG tablet    Meds ordered this encounter  Medications   metFORMIN (GLUCOPHAGE-XR) 500 MG 24 hr tablet    Sig: Take 1 tablet (500 mg total) by mouth 2 (two) times daily with a meal.    Dispense:  180 tablet    Refill:  1   sildenafil (VIAGRA) 25 MG tablet    Sig: Take 1 tablet (25 mg total) by mouth daily as needed for erectile dysfunction.    Dispense:  10 tablet    Refill:  3    Follow-up:  Return in about 6 months (around 12/27/2023).  Everlene Other DO [facility]

## 2023-06-26 NOTE — Telephone Encounter (Signed)
Patient was just seen and forgot to mention needing all medications refilled

## 2023-06-26 NOTE — Patient Instructions (Signed)
Colonoscopy at the first of the year.   Continue your medications.  Follow up in 6 months.

## 2023-06-26 NOTE — Assessment & Plan Note (Signed)
Stable on enalapril.  Continue.

## 2023-06-26 NOTE — Telephone Encounter (Signed)
Informed Dr Adriana Simas of refill request, he refilled what was needed but most have recently been refilled

## 2023-06-26 NOTE — Assessment & Plan Note (Signed)
Has been well-controlled on Lipitor.  However, the dose has been decreased.  Awaiting lipid panel in regards to dose titration.

## 2023-06-26 NOTE — Assessment & Plan Note (Signed)
Recently been uncontrolled.  Patient is back on track regarding his diet.  Continue metformin and Jardiance.

## 2023-08-14 DIAGNOSIS — E119 Type 2 diabetes mellitus without complications: Secondary | ICD-10-CM | POA: Diagnosis not present

## 2023-08-14 DIAGNOSIS — E038 Other specified hypothyroidism: Secondary | ICD-10-CM | POA: Diagnosis not present

## 2023-08-14 DIAGNOSIS — E782 Mixed hyperlipidemia: Secondary | ICD-10-CM | POA: Diagnosis not present

## 2023-08-21 ENCOUNTER — Other Ambulatory Visit: Payer: Self-pay | Admitting: "Endocrinology

## 2023-08-21 DIAGNOSIS — E038 Other specified hypothyroidism: Secondary | ICD-10-CM

## 2023-08-22 ENCOUNTER — Ambulatory Visit (INDEPENDENT_AMBULATORY_CARE_PROVIDER_SITE_OTHER): Payer: BC Managed Care – PPO | Admitting: "Endocrinology

## 2023-08-22 ENCOUNTER — Encounter: Payer: Self-pay | Admitting: "Endocrinology

## 2023-08-22 VITALS — BP 96/60 | HR 76 | Ht 70.0 in | Wt 164.4 lb

## 2023-08-22 DIAGNOSIS — E119 Type 2 diabetes mellitus without complications: Secondary | ICD-10-CM | POA: Diagnosis not present

## 2023-08-22 DIAGNOSIS — E782 Mixed hyperlipidemia: Secondary | ICD-10-CM | POA: Diagnosis not present

## 2023-08-22 DIAGNOSIS — Z7984 Long term (current) use of oral hypoglycemic drugs: Secondary | ICD-10-CM

## 2023-08-22 DIAGNOSIS — I10 Essential (primary) hypertension: Secondary | ICD-10-CM | POA: Diagnosis not present

## 2023-08-22 DIAGNOSIS — E038 Other specified hypothyroidism: Secondary | ICD-10-CM

## 2023-08-22 LAB — POCT GLYCOSYLATED HEMOGLOBIN (HGB A1C): HbA1c, POC (controlled diabetic range): 7.5 % — AB (ref 0.0–7.0)

## 2023-08-22 MED ORDER — EMPAGLIFLOZIN 25 MG PO TABS: 25.0000 mg | ORAL_TABLET | Freq: Every day | ORAL | 1 refills | Status: DC

## 2023-08-22 NOTE — Patient Instructions (Signed)
                                     Advice for Weight Management  -For most of us the best way to lose weight is by diet management. Generally speaking, diet management means consuming less calories intentionally which over time brings about progressive weight loss.  This can be achieved more effectively by avoiding ultra processed carbohydrates, processed meats, unhealthy fats.    It is critically important to know your numbers: how much calorie you are consuming and how much calorie you need. More importantly, our carbohydrates sources should be unprocessed naturally occurring  complex starch food items.  It is always important to balance nutrition also by  appropriate intake of proteins (mainly plant-based), healthy fats/oils, plenty of fruits and vegetables.   -The American College of Lifestyle Medicine (ACL M) recommends nutrition derived mostly from Whole Food, Plant Predominant Sources example an apple instead of applesauce or apple pie. Eat Plenty of vegetables, Mushrooms, fruits, Legumes, Whole Grains, Nuts, seeds in lieu of processed meats, processed snacks/pastries red meat, poultry, eggs.  Use only water or unsweetened tea for hydration.  The College also recommends the need to stay away from risky substances including alcohol, smoking; obtaining 7-9 hours of restorative sleep, at least 150 minutes of moderate intensity exercise weekly, importance of healthy social connections, and being mindful of stress and seek help when it is overwhelming.    -Sticking to a routine mealtime to eat 3 meals a day and avoiding unnecessary snacks is shown to have a big role in weight control. Under normal circumstances, the only time we burn stored energy is when we are hungry, so allow  some hunger to take place- hunger means no food between appropriate meal times, only water.  It is not advisable to starve.   -It is better to avoid simple carbohydrates including:  Cakes, Sweet Desserts, Ice Cream, Soda (diet and regular), Sweet Tea, Candies, Chips, Cookies, Store Bought Juices, Alcohol in Excess of  1-2 drinks a day, Lemonade,  Artificial Sweeteners, Doughnuts, Coffee Creamers, "Sugar-free" Products, etc, etc.  This is not a complete list.....    -Consulting with certified diabetes educators is proven to provide you with the most accurate and current information on diet.  Also, you may be  interested in discussing diet options/exchanges , we can schedule a visit with Grant Torres, RDN, CDE for individualized nutrition education.  -Exercise: If you are able: 30 -60 minutes a day ,4 days a week, or 150 minutes of moderate intensity exercise weekly.    The longer the better if tolerated.  Combine stretch, strength, and aerobic activities.  If you were told in the past that you have high risk for cardiovascular diseases, or if you are currently symptomatic, you may seek evaluation by your heart doctor prior to initiating moderate to intense exercise programs.                                  Additional Care Considerations for Diabetes/Prediabetes   -Diabetes  is a chronic disease.  The most important care consideration is regular follow-up with your diabetes care provider with the goal being avoiding or delaying its complications and to take advantage of advances in medications and technology.  If appropriate actions are taken early enough, type 2 diabetes can even be   reversed.  Seek information from the right source.  - Whole Food, Plant Predominant Nutrition is highly recommended: Eat Plenty of vegetables, Mushrooms, fruits, Legumes, Whole Grains, Nuts, seeds in lieu of processed meats, processed snacks/pastries red meat, poultry, eggs as recommended by American College of  Lifestyle Medicine (ACLM).  -Type 2 diabetes is known to coexist with other important comorbidities such as high blood pressure and high cholesterol.  It is critical to control not only the  diabetes but also the high blood pressure and high cholesterol to minimize and delay the risk of complications including coronary artery disease, stroke, amputations, blindness, etc.  The good news is that this diet recommendation for type 2 diabetes is also very helpful for managing high cholesterol and high blood blood pressure.  - Studies showed that people with diabetes will benefit from a class of medications known as ACE inhibitors and statins.  Unless there are specific reasons not to be on these medications, the standard of care is to consider getting one from these groups of medications at an optimal doses.  These medications are generally considered safe and proven to help protect the heart and the kidneys.    - People with diabetes are encouraged to initiate and maintain regular follow-up with eye doctors, foot doctors, dentists , and if necessary heart and kidney doctors.     - It is highly recommended that people with diabetes quit smoking or stay away from smoking, and get yearly  flu vaccine and pneumonia vaccine at least every 5 years.  See above for additional recommendations on exercise, sleep, stress management , and healthy social connections.      

## 2023-08-22 NOTE — Progress Notes (Signed)
08/22/2023, 9:48 AM  Endocrinology follow-up note   Subjective:    Patient ID: Grant Torres, male    DOB: 07/19/1961.  Grant Torres is being seen in follow-up after he was seen in consultation for management of currently uncontrolled asymptomatic diabetes requested by  Tommie Sams, DO.   Past Medical History:  Diagnosis Date   Diabetes mellitus without complication (HCC)    Hyperlipidemia    Hypertension    Microproteinuria     Past Surgical History:  Procedure Laterality Date   CERVICAL SPINE SURGERY  11/25/2015   COLONOSCOPY N/A 04/10/2018   Procedure: COLONOSCOPY;  Surgeon: Malissa Hippo, MD;  Location: [facility];  Service: Endoscopy;  Laterality: N/A;  200   POLYPECTOMY  04/10/2018   Procedure: POLYPECTOMY;  Surgeon: Malissa Hippo, MD;  Location: [facility];  Service: Endoscopy;;  colon   VASECTOMY     WISDOM TOOTH EXTRACTION      Social History   Socioeconomic History   Marital status: Married    Spouse name: Not on file   Number of children: Not on file   Years of education: Not on file   Highest education level: Not on file  Occupational History   Not on file  Tobacco Use   Smoking status: Never   Smokeless tobacco: Never  Vaping Use   Vaping status: Never Used  Substance and Sexual Activity   Alcohol use: Not on file    Comment: occasionally   Drug use: Never   Sexual activity: Not on file  Other Topics Concern   Not on file  Social History Narrative   Not on file   Social Determinants of Health   Financial Resource Strain: Not on file  Food Insecurity: Not on file  Transportation Needs: Not on file  Physical Activity: Not on file  Stress: Not on file  Social Connections: Not on file    Family History  Problem Relation Age of Onset   Kidney disease Mother    Diabetes Mother    Heart attack Mother    Stroke Mother    Heart failure Mother     Heart attack Father    Hypertension Sister    Colon cancer Neg Hx    Thyroid disease Neg Hx     Outpatient Encounter Medications as of 08/22/2023  Medication Sig   ACCU-CHEK GUIDE test strip USE 1 TWICE DAILY TO CHECK BLOOD GLUCOSE   Accu-Chek Softclix Lancets lancets USE 1  TO CHECK GLUCOSE TWICE DAILY   Accu-Chek Softclix Lancets lancets USE 1 LANCET TO CHECK GLUCOSE TWICE DAILY   aspirin EC 81 MG tablet Take 81 mg by mouth daily.   atorvastatin (LIPITOR) 10 MG tablet Take 1/2 tab p.o. daily. (Patient taking differently: Take 1/4 tab p.o. daily.)   blood glucose meter kit and supplies KIT Dispense based on patient and insurance preference. Use to test blood sugar twice daily. ICD 10 code E11.9   Blood Glucose Monitoring Suppl (ONETOUCH VERIO) w/Device KIT TEST ONCE DAILY   empagliflozin (JARDIANCE) 25 MG TABS tablet Take 1 tablet (  25 mg total) by mouth daily before breakfast.   enalapril (VASOTEC) 10 MG tablet Take 1 tablet by mouth once daily (Patient taking differently: Take 0.5 tablets by mouth daily. Take 1 tablet by mouth once daily)   levothyroxine (SYNTHROID) 50 MCG tablet TAKE 1 TABLET BY MOUTH BEFORE BREAKFAST   metFORMIN (GLUCOPHAGE-XR) 500 MG 24 hr tablet Take 1 tablet (500 mg total) by mouth 2 (two) times daily with a meal.   sildenafil (VIAGRA) 25 MG tablet Take 1 tablet (25 mg total) by mouth daily as needed for erectile dysfunction.   [DISCONTINUED] empagliflozin (JARDIANCE) 10 MG TABS tablet Take 1 tablet (10 mg total) by mouth daily before breakfast.   No facility-administered encounter medications on file as of 08/22/2023.    ALLERGIES: No Known Allergies  VACCINATION STATUS: Immunization History  Administered Date(s) Administered   Influenza, Quadrivalent, Recombinant, Inj, Pf 08/04/2019   Influenza,inj,Quad PF,6+ Mos 10/26/2015, 08/15/2018   Influenza-Unspecified 08/05/2013, 07/26/2016, 07/22/2020, 10/10/2021, 11/24/2022   PFIZER(Purple Top)SARS-COV-2  Vaccination 01/03/2020, 01/24/2020   Pneumococcal-Unspecified 11/13/2002   Tdap 11/26/2018    Diabetes He presents for his follow-up diabetic visit. He has type 2 diabetes mellitus. Onset time: He was diagnosed at approximate age of 36 years. His disease course has been improving. There are no hypoglycemic associated symptoms. Pertinent negatives for hypoglycemia include no confusion, headaches, pallor or seizures. There are no diabetic associated symptoms. Pertinent negatives for diabetes include no chest pain, no fatigue, no polydipsia, no polyphagia, no polyuria and no weakness. There are no hypoglycemic complications. Symptoms are improving. Diabetic complications include impotence. Risk factors for coronary artery disease include diabetes mellitus, dyslipidemia, hypertension, male sex and family history. Current diabetic treatments: He is currently on metformin 500 mg p.o. twice daily, Jardiance 25 mg p.o. once a day. His weight is fluctuating minimally (Patient reports that historically he weighed as high as 211 pounds, currently 154 pounds.). He is following a generally unhealthy diet. When asked about meal planning, he reported none. He has not had a previous visit with a dietitian. He participates in exercise intermittently. His home blood glucose trend is decreasing steadily. (He presents with improving glycemic profile and point-of-care A1c was 7.5%.  He did not document any hypoglycemia.  He tolerated his Jardiance.     ) An ACE inhibitor/angiotensin II receptor blocker is being taken.  Hyperlipidemia This is a chronic problem. The current episode started more than 1 year ago. The problem is controlled. Exacerbating diseases include diabetes. Pertinent negatives include no chest pain, myalgias or shortness of breath. Risk factors for coronary artery disease include diabetes mellitus, dyslipidemia, family history, hypertension and male sex.  Hypertension This is a chronic problem. The current  episode started more than 1 year ago. Pertinent negatives include no chest pain, headaches, neck pain, palpitations or shortness of breath. Risk factors for coronary artery disease include dyslipidemia, diabetes mellitus and family history. Past treatments include ACE inhibitors. Identifiable causes of hypertension include a thyroid problem.  Thyroid Problem Presents for initial visit. Onset time: Patient is known to have multinodular goiter between 2017-2019 and thyroid sonograms.  He also has multiple nodules in his thyroid.  1 nodule was biopsied with benign outcomes. Patient reports no constipation, diarrhea, fatigue or palpitations. Past treatments include levothyroxine. Prior procedures include thyroid FNA. The following procedures have not been performed: radioiodine uptake scan and thyroidectomy. His past medical history is significant for diabetes and hyperlipidemia.     Review of Systems  Constitutional:  Negative for chills, fatigue,  fever and unexpected weight change.  HENT:  Negative for dental problem, mouth sores and trouble swallowing.   Eyes:  Negative for visual disturbance.  Respiratory:  Negative for cough, choking, chest tightness, shortness of breath and wheezing.   Cardiovascular:  Negative for chest pain, palpitations and leg swelling.  Gastrointestinal:  Negative for abdominal distention, abdominal pain, constipation, diarrhea, nausea and vomiting.  Endocrine: Negative for polydipsia, polyphagia and polyuria.  Genitourinary:  Positive for impotence. Negative for dysuria, flank pain, hematuria and urgency.  Musculoskeletal:  Negative for back pain, gait problem, myalgias and neck pain.  Skin:  Negative for pallor, rash and wound.  Neurological:  Negative for seizures, syncope, weakness, numbness and headaches.  Psychiatric/Behavioral:  Negative for confusion and dysphoric mood.     Objective:       08/22/2023    8:19 AM 06/26/2023    8:22 AM 04/18/2023    8:51 AM   Vitals with BMI  Height 5\' 10"  5\' 10"  5\' 10"   Weight 164 lbs 6 oz 163 lbs 13 oz 163 lbs 3 oz  BMI 23.59 23.5 23.42  Systolic 96 125 96  Diastolic 60 75 60  Pulse 76 79 76    BP 96/60   Pulse 76   Ht 5\' 10"  (1.778 m)   Wt 164 lb 6.4 oz (74.6 kg)   BMI 23.59 kg/m   Wt Readings from Last 3 Encounters:  08/22/23 164 lb 6.4 oz (74.6 kg)  06/26/23 163 lb 12.8 oz (74.3 kg)  04/18/23 163 lb 3.2 oz (74 kg)      CMP ( most recent) CMP     Component Value Date/Time   NA 139 08/14/2023 0809   K 4.9 08/14/2023 0809   CL 100 08/14/2023 0809   CO2 26 08/14/2023 0809   GLUCOSE 184 (H) 08/14/2023 0809   GLUCOSE 224 (H) 10/20/2014 0937   BUN 13 08/14/2023 0809   CREATININE 0.71 (L) 08/14/2023 0809   CREATININE 0.77 10/20/2014 0937   CALCIUM 9.6 08/14/2023 0809   PROT 6.9 08/14/2023 0809   ALBUMIN 4.7 08/14/2023 0809   AST 20 08/14/2023 0809   ALT 24 08/14/2023 0809   ALKPHOS 60 08/14/2023 0809   BILITOT 0.8 08/14/2023 0809   GFRNONAA 103 08/01/2020 0904   GFRAA 120 08/01/2020 0904     Diabetic Labs (most recent): Lab Results  Component Value Date   HGBA1C 7.5 (A) 08/22/2023   HGBA1C 7.8 (A) 04/18/2023   HGBA1C 6.5 12/17/2022   MICROALBUR 3.6 (H) 10/20/2014   MICROALBUR 1.99 (H) 09/01/2013     Lipid Panel ( most recent) Lipid Panel     Component Value Date/Time   CHOL 135 08/14/2023 0809   TRIG 87 08/14/2023 0809   HDL 40 08/14/2023 0809   CHOLHDL 3.4 08/14/2023 0809   CHOLHDL 2.9 10/20/2014 0937   VLDL 20 10/20/2014 0937   LDLCALC 78 08/14/2023 0809   LABVLDL 17 08/14/2023 0809      Lab Results  Component Value Date   TSH 2.990 08/14/2023   TSH 4.130 05/30/2022   TSH 2.22 08/16/2021   TSH 2.66 08/16/2020   TSH 2.710 09/18/2019   TSH 2.76 08/11/2018   TSH 3.880 01/13/2016   FREET4 1.66 08/14/2023   FREET4 1.39 05/30/2022   FREET4 1.10 08/16/2021   FREET4 1.14 08/16/2020   FREET4 1.65 09/18/2019   FREET4 1.05 08/11/2018       Assessment & Plan:    1. Type 2 diabetes mellitus without complication, without  long-term current use of insulin (HCC)  - Grant Torres has currently uncontrolled symptomatic type 2 DM since  63 years of age.  He presents with improving glycemic profile and point-of-care A1c was 7.5%.  He did not document any hypoglycemia.  He tolerated his Jardiance.      He presents with significant improvement in his glycemic profile.  Recent labs reviewed. - I had a long discussion with him about the progressive nature of diabetes and the pathology behind its complications.  -He does not report any gross complications from his diabetes, however he has comorbid conditions of hyperlipidemia, hypertension, and patient remains at a high risk for more acute and chronic complications which include CAD, CVA, CKD, retinopathy, and neuropathy. These are all discussed in detail with him.  - he acknowledges that there is a room for improvement in his food and drink choices. - Suggestion is made for him to avoid simple carbohydrates  from his diet including Cakes, Sweet Desserts, Ice Cream, Soda (diet and regular), Sweet Tea, Candies, Chips, Cookies, Store Bought Juices, Alcohol , Artificial Sweeteners,  Coffee Creamer, and "Sugar-free" Products, Lemonade. This will help patient to have more stable blood glucose profile and potentially avoid unintended weight gain.  The following Lifestyle Medicine recommendations according to American College of Lifestyle Medicine  [facility]) were discussed and and offered to patient and he  agrees to start the journey:  A. Whole Foods, Plant-Based Nutrition comprising of fruits and vegetables, plant-based proteins, whole-grain carbohydrates was discussed in detail with the patient.   A list for source of those nutrients were also provided to the patient.  Patient will use only water or unsweetened tea for hydration. B.  The need to stay away from risky substances including alcohol, smoking; obtaining 7 to 9  hours of restorative sleep, at least 150 minutes of moderate intensity exercise weekly, the importance of healthy social connections,  and stress management techniques were discussed. C.  A full color page of  Calorie density of various food groups per pound showing examples of each food groups was provided to the patient.   - he is scheduled with Norm Salt, RDN, CDE for individualized diabetes education.  - I have approached him with the following plan to manage  his diabetes and patient agrees:   -He presents with improved glycemic profile.  He will benefit from a higher dose of Jardiance.  I discussed and increase his Jardiance to 25 mg p.o. daily at bedtime.  Side effects and precautions discussed with him.  He is advised to continue metformin 500 mg p.o. twice daily with breakfast and supper.      He is advised to continue monitoring blood glucose twice a day-daily before breakfast and at bedtime.   - he is encouraged to call clinic for blood glucose levels less than 70 or above 200 mg /dl. He is glutamic positive thrombophilic antibodies are not elevated, IA-2 antibodies are pending.  - Specific targets for  A1c;  LDL, HDL,  and Triglycerides were discussed with the patient.  2) Blood Pressure /Hypertension:  -His blood pressure is controlled to target.  He is advised to lower his enalapril to 5 mg p.o. daily at breakfast   3) Lipids/Hyperlipidemia:   Review of his recent lipid panel showed controlled LDL at 78.  He is advised to continue lovastatin 10 mg pills which has been side effects and precautions discussed with him.     4)  Weight/Diet:  Body mass index is  23.59 kg/m.  -    he is not a candidate for weight loss. I discussed with him the fact that loss of 5 - 10% of his  current body weight will have the most impact on his diabetes management.  The above detailed  ACLM recommendations for nutrition, exercise, sleep, social life, avoidance of risky substances, the need for  restorative sleep   information will also detailed on discharge instructions.   5) hypothyroidism: His thyroid function tests are within appropriate replacement.  He is advised to continue levothyroxine 50 mcg p.o. daily before breakfast.     - We discussed about the correct intake of his thyroid hormone, on empty stomach at fasting, with water, separated by at least 30 minutes from breakfast and other medications,  and separated by more than 4 hours from calcium, iron, multivitamins, acid reflux medications (PPIs). -Patient is made aware of the fact that thyroid hormone replacement is needed for life, dose to be adjusted by periodic monitoring of thyroid function tests.   6) multinodular goiter: Review of his ultrasound between 2017-2019 documented multinodular goiter.  He does have one of his nodules biopsied with benign outcomes.   He will have a surveillance thyroid ultrasound once he is insurance situation is settled.  He wishes to postpone surveillance thyroid ultrasound until after his next visit.    7) Chronic Care/Health Maintenance:  -he  is on ACEI/ARB and Statin medications and  is encouraged to initiate and continue to follow up with Ophthalmology, Dentist,  Podiatrist at least yearly or according to recommendations, and advised to   stay away from smoking. I have recommended yearly flu vaccine and pneumonia vaccine at least every 5 years; moderate intensity exercise for up to 150 minutes weekly; and  sleep for 7- 9 hours a day.  Regarding his concern the ED : He may benefit from low-dose  I discussed and prescribed low-dose Viagra  25 mg p.o. as needed.  - he is  advised to maintain close follow up with Tommie Sams, DO for primary care needs, as well as his other providers for optimal and coordinated care.   I spent  26  minutes in the care of the patient today including review of labs from CMP, Lipids, Thyroid Function, Hematology (current and previous including abstractions  from other facilities); face-to-face time discussing  his blood glucose readings/logs, discussing hypoglycemia and hyperglycemia episodes and symptoms, medications doses, his options of short and long term treatment based on the latest standards of care / guidelines;  discussion about incorporating lifestyle medicine;  and documenting the encounter. Risk reduction counseling performed per USPSTF guidelines to reduce  cardiovascular risk factors.     Please refer to Patient Instructions for Blood Glucose Monitoring and Insulin/Medications Dosing Guide"  in media tab for additional information. Please  also refer to " Patient Self Inventory" in the Media  tab for reviewed elements of pertinent patient history.  Grant Torres participated in the discussions, expressed understanding, and voiced agreement with the above plans.  All questions were answered to his satisfaction. he is encouraged to contact clinic should he have any questions or concerns prior to his return visit.      Follow up plan: - Return in about 4 months (around 12/23/2023) for Bring Meter/CGM Device/Logs- A1c in Office.  Marquis Lunch, MD [facility] [address] [place], [place] [address] Phone: [phone]  Fax: [phone]    08/22/2023, 9:48 AM  This note  was partially dictated with voice recognition software. Similar sounding words can be transcribed inadequately or may not  be corrected upon review.

## 2023-08-29 LAB — LIPID PANEL
Chol/HDL Ratio: 3.4 {ratio} (ref 0.0–5.0)
Cholesterol, Total: 135 mg/dL (ref 100–199)
HDL: 40 mg/dL (ref >39)
LDL Chol Calc (NIH): 78 mg/dL (ref 0–99)
Triglycerides: 87 mg/dL (ref 0–149)
VLDL Cholesterol Cal: 17 mg/dL (ref 5–40)

## 2023-08-29 LAB — TSH: TSH: 2.99 u[IU]/mL (ref 0.450–4.500)

## 2023-08-29 LAB — COMPREHENSIVE METABOLIC PANEL
ALT: 24 [IU]/L (ref 0–44)
AST: 20 [IU]/L (ref 0–40)
Albumin: 4.7 g/dL (ref 3.9–4.9)
Alkaline Phosphatase: 60 [IU]/L (ref 44–121)
BUN/Creatinine Ratio: 18 (ref 10–24)
BUN: 13 mg/dL (ref 8–27)
Bilirubin Total: 0.8 mg/dL (ref 0.0–1.2)
CO2: 26 mmol/L (ref 20–29)
Calcium: 9.6 mg/dL (ref 8.6–10.2)
Chloride: 100 mmol/L (ref 96–106)
Creatinine, Ser: 0.71 mg/dL — ABNORMAL LOW (ref 0.76–1.27)
Globulin, Total: 2.2 g/dL (ref 1.5–4.5)
Glucose: 184 mg/dL — ABNORMAL HIGH (ref 70–99)
Potassium: 4.9 mmol/L (ref 3.5–5.2)
Sodium: 139 mmol/L (ref 134–144)
Total Protein: 6.9 g/dL (ref 6.0–8.5)
eGFR: 104 mL/min/{1.73_m2} (ref >59)

## 2023-08-29 LAB — IA-2 AUTOANTIBODIES: IA-2 Autoantibodies: <7.5 U/mL

## 2023-08-29 LAB — GLUTAMIC ACID DECARBOXYLASE AUTO ABS: Glutamic Acid Decarb Ab: <5 U/mL (ref 0.0–5.0)

## 2023-08-29 LAB — T4, FREE: Free T4: 1.66 ng/dL (ref 0.82–1.77)

## 2023-11-22 ENCOUNTER — Other Ambulatory Visit: Payer: Self-pay | Admitting: "Endocrinology

## 2023-11-22 DIAGNOSIS — E038 Other specified hypothyroidism: Secondary | ICD-10-CM

## 2023-12-26 ENCOUNTER — Ambulatory Visit (INDEPENDENT_AMBULATORY_CARE_PROVIDER_SITE_OTHER): Payer: BC Managed Care – PPO | Admitting: "Endocrinology

## 2023-12-26 ENCOUNTER — Encounter: Payer: Self-pay | Admitting: "Endocrinology

## 2023-12-26 VITALS — BP 126/68 | HR 76 | Ht 70.0 in | Wt 163.0 lb

## 2023-12-26 DIAGNOSIS — E119 Type 2 diabetes mellitus without complications: Secondary | ICD-10-CM | POA: Diagnosis not present

## 2023-12-26 DIAGNOSIS — E782 Mixed hyperlipidemia: Secondary | ICD-10-CM

## 2023-12-26 DIAGNOSIS — I10 Essential (primary) hypertension: Secondary | ICD-10-CM | POA: Diagnosis not present

## 2023-12-26 DIAGNOSIS — E042 Nontoxic multinodular goiter: Secondary | ICD-10-CM

## 2023-12-26 DIAGNOSIS — E038 Other specified hypothyroidism: Secondary | ICD-10-CM

## 2023-12-26 DIAGNOSIS — Z7984 Long term (current) use of oral hypoglycemic drugs: Secondary | ICD-10-CM

## 2023-12-26 LAB — POCT GLYCOSYLATED HEMOGLOBIN (HGB A1C): HbA1c, POC (controlled diabetic range): 7.8 % — AB (ref 0.0–7.0)

## 2023-12-26 MED ORDER — EMPAGLIFLOZIN 25 MG PO TABS: 25.0000 mg | ORAL_TABLET | Freq: Every day | ORAL | 1 refills | Status: DC

## 2023-12-26 MED ORDER — LEVOTHYROXINE SODIUM 50 MCG PO TABS: 50.0000 ug | ORAL_TABLET | Freq: Every day | ORAL | 1 refills | Status: DC

## 2023-12-26 MED ORDER — METFORMIN HCL ER 500 MG PO TB24: 500.0000 mg | ORAL_TABLET | Freq: Two times a day (BID) | ORAL | 1 refills | Status: DC

## 2023-12-26 NOTE — Progress Notes (Signed)
 12/26/2023, 8:50 AM  Endocrinology follow-up note   Subjective:    Patient ID: Grant Torres, male    DOB: 04-04-1961.  Grant Torres is being seen in follow-up after he was seen in consultation for management of currently uncontrolled asymptomatic diabetes requested by  Tommie Sams, DO.   Past Medical History:  Diagnosis Date   Diabetes mellitus without complication (HCC)    Hyperlipidemia    Hypertension    Microproteinuria     Past Surgical History:  Procedure Laterality Date   CERVICAL SPINE SURGERY  11/25/2015   COLONOSCOPY N/A 04/10/2018   Procedure: COLONOSCOPY;  Surgeon: Malissa Hippo, MD;  Location: [facility];  Service: Endoscopy;  Laterality: N/A;  200   POLYPECTOMY  04/10/2018   Procedure: POLYPECTOMY;  Surgeon: Malissa Hippo, MD;  Location: [facility];  Service: Endoscopy;;  colon   VASECTOMY     WISDOM TOOTH EXTRACTION      Social History   Socioeconomic History   Marital status: Married    Spouse name: Not on file   Number of children: Not on file   Years of education: Not on file   Highest education level: Not on file  Occupational History   Not on file  Tobacco Use   Smoking status: Never   Smokeless tobacco: Never  Vaping Use   Vaping status: Never Used  Substance and Sexual Activity   Alcohol use: Not on file    Comment: occasionally   Drug use: Never   Sexual activity: Not on file  Other Topics Concern   Not on file  Social History Narrative   Not on file   Social Drivers of Health   Financial Resource Strain: Not on file  Food Insecurity: Not on file  Transportation Needs: Not on file  Physical Activity: Not on file  Stress: Not on file  Social Connections: Not on file    Family History  Problem Relation Age of Onset   Kidney disease Mother    Diabetes Mother    Heart attack Mother    Stroke Mother    Heart failure Mother     Heart attack Father    Hypertension Sister    Colon cancer Neg Hx    Thyroid disease Neg Hx     Outpatient Encounter Medications as of 12/26/2023  Medication Sig   ACCU-CHEK GUIDE test strip USE 1 TWICE DAILY TO CHECK BLOOD GLUCOSE   Accu-Chek Softclix Lancets lancets USE 1  TO CHECK GLUCOSE TWICE DAILY   Accu-Chek Softclix Lancets lancets USE 1 LANCET TO CHECK GLUCOSE TWICE DAILY   aspirin EC 81 MG tablet Take 81 mg by mouth daily.   atorvastatin (LIPITOR) 10 MG tablet Take 1/2 tab p.o. daily. (Patient taking differently: Take 1/4 tab p.o. daily.)   blood glucose meter kit and supplies KIT Dispense based on patient and insurance preference. Use to test blood sugar twice daily. ICD 10 code E11.9   Blood Glucose Monitoring Suppl (ONETOUCH VERIO) w/Device KIT TEST ONCE DAILY   empagliflozin (JARDIANCE) 25 MG TABS tablet Take 1 tablet (  25 mg total) by mouth daily before breakfast.   enalapril (VASOTEC) 10 MG tablet Take 1 tablet by mouth once daily (Patient taking differently: Take 0.5 tablets by mouth daily. Take 1 tablet by mouth once daily)   levothyroxine (SYNTHROID) 50 MCG tablet Take 1 tablet (50 mcg total) by mouth daily before breakfast.   metFORMIN (GLUCOPHAGE-XR) 500 MG 24 hr tablet Take 1 tablet (500 mg total) by mouth 2 (two) times daily with a meal.   sildenafil (VIAGRA) 25 MG tablet Take 1 tablet (25 mg total) by mouth daily as needed for erectile dysfunction.   [DISCONTINUED] empagliflozin (JARDIANCE) 25 MG TABS tablet Take 1 tablet (25 mg total) by mouth daily before breakfast.   [DISCONTINUED] levothyroxine (SYNTHROID) 50 MCG tablet TAKE 1 TABLET BY MOUTH BEFORE BREAKFAST   [DISCONTINUED] metFORMIN (GLUCOPHAGE-XR) 500 MG 24 hr tablet Take 1 tablet (500 mg total) by mouth 2 (two) times daily with a meal.   No facility-administered encounter medications on file as of 12/26/2023.    ALLERGIES: No Known Allergies  VACCINATION STATUS: Immunization History  Administered Date(s)  Administered   Influenza, Quadrivalent, Recombinant, Inj, Pf 08/04/2019   Influenza,inj,Quad PF,6+ Mos 10/26/2015, 08/15/2018   Influenza-Unspecified 08/05/2013, 07/26/2016, 07/22/2020, 10/10/2021, 11/24/2022   PFIZER(Purple Top)SARS-COV-2 Vaccination 01/03/2020, 01/24/2020   Pneumococcal-Unspecified 11/13/2002   Tdap 11/26/2018    Diabetes He presents for his follow-up diabetic visit. He has type 2 diabetes mellitus. Onset time: He was diagnosed at approximate age of 63 years. His disease course has been fluctuating. There are no hypoglycemic associated symptoms. Pertinent negatives for hypoglycemia include no confusion, headaches, pallor or seizures. There are no diabetic associated symptoms. Pertinent negatives for diabetes include no chest pain, no fatigue, no polydipsia, no polyphagia, no polyuria and no weakness. There are no hypoglycemic complications. Symptoms are stable. Diabetic complications include impotence. Risk factors for coronary artery disease include diabetes mellitus, dyslipidemia, hypertension, male sex and family history. Current diabetic treatments: He is currently on metformin 500 mg p.o. twice daily, Jardiance 25 mg p.o. once a day. His weight is fluctuating minimally (Patient reports that historically he weighed as high as 211 pounds, currently 154 pounds.). He is following a generally unhealthy diet. When asked about meal planning, he reported none. He has not had a previous visit with a dietitian. He participates in exercise intermittently. His home blood glucose trend is fluctuating minimally. His overall blood glucose range is 140-180 mg/dl. (He presents with fluctuating glycemic profile mostly driven by stress related to his work environment.  His point-of-care A1c 7.8%, increasing from his last visit.  He did not document hypoglycemia.  He continues to tolerate Jardiance and metformin.      ) An ACE inhibitor/angiotensin II receptor blocker is being taken.   Hyperlipidemia This is a chronic problem. The current episode started more than 1 year ago. The problem is controlled. Exacerbating diseases include diabetes. Pertinent negatives include no chest pain, myalgias or shortness of breath. Risk factors for coronary artery disease include diabetes mellitus, dyslipidemia, family history, hypertension and male sex.  Hypertension This is a chronic problem. The current episode started more than 1 year ago. Pertinent negatives include no chest pain, headaches, neck pain, palpitations or shortness of breath. Risk factors for coronary artery disease include dyslipidemia, diabetes mellitus and family history. Past treatments include ACE inhibitors. Identifiable causes of hypertension include a thyroid problem.  Thyroid Problem Presents for initial visit. Onset time: Patient is known to have multinodular goiter between 2017-2019 and thyroid sonograms.  He also has multiple nodules in his thyroid.  1 nodule was biopsied with benign outcomes. Patient reports no constipation, diarrhea, fatigue or palpitations. Past treatments include levothyroxine. Prior procedures include thyroid FNA. The following procedures have not been performed: radioiodine uptake scan and thyroidectomy. His past medical history is significant for diabetes and hyperlipidemia.     Review of Systems  Constitutional:  Negative for chills, fatigue, fever and unexpected weight change.  HENT:  Negative for dental problem, mouth sores and trouble swallowing.   Eyes:  Negative for visual disturbance.  Respiratory:  Negative for cough, choking, chest tightness, shortness of breath and wheezing.   Cardiovascular:  Negative for chest pain, palpitations and leg swelling.  Gastrointestinal:  Negative for abdominal distention, abdominal pain, constipation, diarrhea, nausea and vomiting.  Endocrine: Negative for polydipsia, polyphagia and polyuria.  Genitourinary:  Positive for impotence. Negative for  dysuria, flank pain, hematuria and urgency.  Musculoskeletal:  Negative for back pain, gait problem, myalgias and neck pain.  Skin:  Negative for pallor, rash and wound.  Neurological:  Negative for seizures, syncope, weakness, numbness and headaches.  Psychiatric/Behavioral:  Negative for confusion and dysphoric mood.     Objective:       12/26/2023    8:08 AM 08/22/2023    8:19 AM 06/26/2023    8:22 AM  Vitals with BMI  Height 5\' 10"  5\' 10"  5\' 10"   Weight 163 lbs 164 lbs 6 oz 163 lbs 13 oz  BMI 23.39 23.59 23.5  Systolic 126 96 125  Diastolic 68 60 75  Pulse 76 76 79    BP 126/68   Pulse 76   Ht 5\' 10"  (1.778 m)   Wt 163 lb (73.9 kg)   BMI 23.39 kg/m   Wt Readings from Last 3 Encounters:  12/26/23 163 lb (73.9 kg)  08/22/23 164 lb 6.4 oz (74.6 kg)  06/26/23 163 lb 12.8 oz (74.3 kg)      CMP ( most recent) CMP     Component Value Date/Time   NA 139 08/14/2023 0809   K 4.9 08/14/2023 0809   CL 100 08/14/2023 0809   CO2 26 08/14/2023 0809   GLUCOSE 184 (H) 08/14/2023 0809   GLUCOSE 224 (H) 10/20/2014 0937   BUN 13 08/14/2023 0809   CREATININE 0.71 (L) 08/14/2023 0809   CREATININE 0.77 10/20/2014 0937   CALCIUM 9.6 08/14/2023 0809   PROT 6.9 08/14/2023 0809   ALBUMIN 4.7 08/14/2023 0809   AST 20 08/14/2023 0809   ALT 24 08/14/2023 0809   ALKPHOS 60 08/14/2023 0809   BILITOT 0.8 08/14/2023 0809   GFRNONAA 103 08/01/2020 0904   GFRAA 120 08/01/2020 0904     Diabetic Labs (most recent): Lab Results  Component Value Date   HGBA1C 7.8 (A) 12/26/2023   HGBA1C 7.5 (A) 08/22/2023   HGBA1C 7.8 (A) 04/18/2023   MICROALBUR 3.6 (H) 10/20/2014   MICROALBUR 1.99 (H) 09/01/2013     Lipid Panel ( most recent) Lipid Panel     Component Value Date/Time   CHOL 135 08/14/2023 0809   TRIG 87 08/14/2023 0809   HDL 40 08/14/2023 0809   CHOLHDL 3.4 08/14/2023 0809   CHOLHDL 2.9 10/20/2014 0937   VLDL 20 10/20/2014 0937   LDLCALC 78 08/14/2023 0809   LABVLDL 17  08/14/2023 0809      Lab Results  Component Value Date   TSH 2.990 08/14/2023   TSH 4.130 05/30/2022   TSH 2.22 08/16/2021   TSH 2.66 08/16/2020  TSH 2.710 09/18/2019   TSH 2.76 08/11/2018   TSH 3.880 01/13/2016   FREET4 1.66 08/14/2023   FREET4 1.39 05/30/2022   FREET4 1.10 08/16/2021   FREET4 1.14 08/16/2020   FREET4 1.65 09/18/2019   FREET4 1.05 08/11/2018       Assessment & Plan:   1. Type 2 diabetes mellitus without complication, without long-term current use of insulin (HCC)  - KHRIS JANSSON has currently uncontrolled symptomatic type 2 DM since  63 years of age.  He presents with fluctuating glycemic profile mostly driven by stress related to his work environment.  His point-of-care A1c 7.8%, increasing from his last visit.  He did not document hypoglycemia.  He continues to tolerate Jardiance and metformin.      He presents with significant improvement in his glycemic profile.  Recent labs reviewed. - I had a long discussion with him about the progressive nature of diabetes and the pathology behind its complications.  -He does not report any gross complications from his diabetes, however he has comorbid conditions of hyperlipidemia, hypertension, and patient remains at a high risk for more acute and chronic complications which include CAD, CVA, CKD, retinopathy, and neuropathy. These are all discussed in detail with him.  - he acknowledges that there is a room for improvement in his food and drink choices. - Suggestion is made for him to avoid simple carbohydrates  from his diet including Cakes, Sweet Desserts, Ice Cream, Soda (diet and regular), Sweet Tea, Candies, Chips, Cookies, Store Bought Juices, Alcohol , Artificial Sweeteners,  Coffee Creamer, and "Sugar-free" Products, Lemonade. This will help patient to have more stable blood glucose profile and potentially avoid unintended weight gain.  The following Lifestyle Medicine recommendations according to American  College of Lifestyle Medicine  [facility]) were discussed and and offered to patient and he  agrees to start the journey:  A. Whole Foods, Plant-Based Nutrition comprising of fruits and vegetables, plant-based proteins, whole-grain carbohydrates was discussed in detail with the patient.   A list for source of those nutrients were also provided to the patient.  Patient will use only water or unsweetened tea for hydration. B.  The need to stay away from risky substances including alcohol, smoking; obtaining 7 to 9 hours of restorative sleep, at least 150 minutes of moderate intensity exercise weekly, the importance of healthy social connections,  and stress management techniques were discussed. C.  A full color page of  Calorie density of various food groups per pound showing examples of each food groups was provided to the patient.   - he is scheduled with Norm Salt, RDN, CDE for individualized diabetes education.  - I have approached him with the following plan to manage  his diabetes and patient agrees:   -He presents with slightly above target glycemic profile mainly driven by workplace stress.   -He reports that there is a better prospect looking ahead.  He does not want to escalate medications at this time.  He continues to tolerate his current medications including Jardiance 25 mg p.o. daily at breakfast, and metformin 500 mg p.o. twice daily.   His next option would be adding low-dose glipizide or basal insulin.    He is advised to continue monitoring blood glucose twice a day-daily before breakfast and at bedtime.   - he is encouraged to call clinic for blood glucose levels less than 70 or above 200 mg /dl. He is glutamic acid decarboxylase antibodies as well as IA 2 antibodies were negative.     -  Specific targets for  A1c;  LDL, HDL,  and Triglycerides were discussed with the patient.  2) Blood Pressure /Hypertension:  His blood pressure is controlled to target.  He is advised to  lower his enalapril to 5 mg p.o. daily at breakfast   3) Lipids/Hyperlipidemia:   Review of his recent lipid panel showed controlled LDL at 78.  He is advised to continue lovastatin 10 mg p.o. daily at bedtime.   Side effects and precautions discussed with him.     4)  Weight/Diet:  Body mass index is 23.39 kg/m.  -   He is not a candidate for weight loss.  The above detailed  ACLM recommendations for nutrition, exercise, sleep, social life, avoidance of risky substances, the need for restorative sleep   information will also detailed on discharge instructions.   5) hypothyroidism: His thyroid function tests are within appropriate replacement.  He is advised to continue levothyroxine 50 mcg p.o. daily before breakfast.      - We discussed about the correct intake of his thyroid hormone, on empty stomach at fasting, with water, separated by at least 30 minutes from breakfast and other medications,  and separated by more than 4 hours from calcium, iron, multivitamins, acid reflux medications (PPIs). -Patient is made aware of the fact that thyroid hormone replacement is needed for life, dose to be adjusted by periodic monitoring of thyroid function tests.   6) multinodular goiter: Review of his ultrasound between 2017-2019 documented multinodular goiter.  He does have one of his nodules biopsied with benign outcomes.   He will have a surveillance thyroid ultrasound before his next visit.   7) Chronic Care/Health Maintenance:  -he  is on ACEI/ARB and Statin medications and  is encouraged to initiate and continue to follow up with Ophthalmology, Dentist,  Podiatrist at least yearly or according to recommendations, and advised to   stay away from smoking. I have recommended yearly flu vaccine and pneumonia vaccine at least every 5 years; moderate intensity exercise for up to 150 minutes weekly; and  sleep for 7- 9 hours a day.  Regarding his concern the ED : He may benefit from low-dose  I  discussed and prescribed low-dose Viagra  25 mg p.o. as needed.  - he is  advised to maintain close follow up with Tommie Sams, DO for primary care needs, as well as his other providers for optimal and coordinated care.  I spent  26  minutes in the care of the patient today including review of labs from CMP, Lipids, Thyroid Function, Hematology (current and previous including abstractions from other facilities); face-to-face time discussing  his blood glucose readings/logs, discussing hypoglycemia and hyperglycemia episodes and symptoms, medications doses, his options of short and long term treatment based on the latest standards of care / guidelines;  discussion about incorporating lifestyle medicine;  and documenting the encounter. Risk reduction counseling performed per USPSTF guidelines to reduce  cardiovascular risk factors.     Please refer to Patient Instructions for Blood Glucose Monitoring and Insulin/Medications Dosing Guide"  in media tab for additional information. Please  also refer to " Patient Self Inventory" in the Media  tab for reviewed elements of pertinent patient history.  Grant Torres participated in the discussions, expressed understanding, and voiced agreement with the above plans.  All questions were answered to his satisfaction. he is encouraged to contact clinic should he have any questions or concerns prior to his return visit.  Follow up plan: -  Return in about 4 months (around 04/24/2024) for F/U with Pre-visit Labs, Meter/CGM/Logs, A1c here, Thyroid / Neck Ultrasound.  Marquis Lunch, MD [facility] [address] [place], [place] [address] Phone: [phone]  Fax: [phone]    12/26/2023, 8:50 AM  This note was partially dictated with voice recognition software. Similar sounding words can be transcribed inadequately or may not  be corrected upon review.

## 2023-12-26 NOTE — Patient Instructions (Signed)

## 2023-12-30 ENCOUNTER — Ambulatory Visit: Payer: BC Managed Care – PPO | Admitting: Family Medicine

## 2023-12-30 VITALS — BP 103/63 | HR 70 | Temp 98.1°F | Ht 70.0 in | Wt 160.8 lb

## 2023-12-30 DIAGNOSIS — Z1211 Encounter for screening for malignant neoplasm of colon: Secondary | ICD-10-CM

## 2023-12-30 DIAGNOSIS — E782 Mixed hyperlipidemia: Secondary | ICD-10-CM

## 2023-12-30 DIAGNOSIS — Z7984 Long term (current) use of oral hypoglycemic drugs: Secondary | ICD-10-CM | POA: Diagnosis not present

## 2023-12-30 DIAGNOSIS — E1169 Type 2 diabetes mellitus with other specified complication: Secondary | ICD-10-CM | POA: Diagnosis not present

## 2023-12-30 DIAGNOSIS — E119 Type 2 diabetes mellitus without complications: Secondary | ICD-10-CM | POA: Diagnosis not present

## 2023-12-30 DIAGNOSIS — I10 Essential (primary) hypertension: Secondary | ICD-10-CM

## 2023-12-30 NOTE — Assessment & Plan Note (Signed)
 Would like better control of LDL.  Patient will continue dietary/lifestyle changes.  Continue Lipitor.

## 2023-12-30 NOTE — Assessment & Plan Note (Signed)
 Following with endocrinology.  Needs better control.

## 2023-12-30 NOTE — Patient Instructions (Signed)
 Referral placed.  Follow up in 6 months.  Message with concerns.  Take care  Dr. Adriana Simas

## 2023-12-30 NOTE — Assessment & Plan Note (Signed)
BP stable.  Continue enalapril.

## 2023-12-30 NOTE — Progress Notes (Signed)
 Subjective:  Patient ID: Grant Torres, male    DOB: [birth year]  Age: 63 y.o. MRN: [id number]  CC: Follow-up   HPI:  63 year old male presents for follow-up.  Hypertension stable on low-dose enalapril.  Patient following with endocrinology regarding type 2 diabetes.  Last A1c 7.8.  He is trying to watch his diet.  Remains on metformin and Jardiance.  Last lipid panel revealed an LDL of 78.  Patient is on Lipitor 10 mg daily.  Patient is overdue for colonoscopy.  Will discuss this today.  Patient states that his last eye exam was in January of this year.  It was through my eye doctor and medicine.  Health maintenance section reflects that he needs a pneumococcal vaccination.  Per ACIP, this is not recommended until he turns 66 given prior vaccination.  Patient Active Problem List   Diagnosis Date Noted   Essential hypertension, benign 12/21/2021   Hypothyroidism 08/18/2019   Multinodular goiter 08/11/2018   Mixed hyperlipidemia 09/01/2013   Diabetes (HCC) 09/01/2013    Social Hx   Social History   Socioeconomic History   Marital status: Married    Spouse name: Not on file   Number of children: Not on file   Years of education: Not on file   Highest education level: Some college, no degree  Occupational History   Not on file  Tobacco Use   Smoking status: Never   Smokeless tobacco: Never  Vaping Use   Vaping status: Never Used  Substance and Sexual Activity   Alcohol use: Not on file    Comment: occasionally   Drug use: Never   Sexual activity: Not on file  Other Topics Concern   Not on file  Social History Narrative   Not on file   Social Drivers of Health   Financial Resource Strain: Low Risk  (12/26/2023)   Overall Financial Resource Strain (CARDIA)    Difficulty of Paying Living Expenses: Not very hard  Food Insecurity: No Food Insecurity (12/26/2023)   Hunger Vital Sign    Worried About Running Out of Food in the Last Year: Never true    Ran Out of  Food in the Last Year: Never true  Transportation Needs: No Transportation Needs (12/26/2023)   PRAPARE - Administrator, Civil Service (Medical): No    Lack of Transportation (Non-Medical): No  Physical Activity: Insufficiently Active (12/26/2023)   Exercise Vital Sign    Days of Exercise per Week: 1 day    Minutes of Exercise per Session: 20 min  Stress: No Stress Concern Present (12/26/2023)   [employer] of Occupational Health - Occupational Stress Questionnaire    Feeling of Stress : Only a little  Social Connections: Unknown (12/26/2023)   Social Connection and Isolation Panel [NHANES]    Frequency of Communication with Friends and Family: Patient declined    Frequency of Social Gatherings with Friends and Family: Patient declined    Attends Religious Services: More than 4 times per year    Active Member of [employer] or Organizations: No    Attends Engineer, structural: Not on file    Marital Status: Married    Review of Systems  Constitutional: Negative.    Objective:  BP 103/63   Pulse 70   Temp 98.1 F (36.7 C)   Ht 5\' 10"  (1.778 m)   Wt 160 lb 12.8 oz (72.9 kg)   SpO2 100%   BMI 23.07 kg/m      12/30/2023  8:12 AM 12/26/2023    8:08 AM 08/22/2023    8:19 AM  BP/Weight  Systolic BP 103 126 96  Diastolic BP 63 68 60  Wt. (Lbs) 160.8 163 164.4  BMI 23.07 kg/m2 23.39 kg/m2 23.59 kg/m2    Physical Exam Vitals and nursing note reviewed.  Constitutional:      General: He is not in acute distress.    Appearance: Normal appearance.  HENT:     Head: Normocephalic and atraumatic.  Eyes:     General:        Right eye: No discharge.        Left eye: No discharge.     Conjunctiva/sclera: Conjunctivae normal.  Cardiovascular:     Rate and Rhythm: Normal rate and regular rhythm.  Pulmonary:     Effort: Pulmonary effort is normal.     Breath sounds: Normal breath sounds. No wheezing, rhonchi or rales.  Neurological:     Mental Status: He is  alert.  Psychiatric:        Mood and Affect: Mood normal.        Behavior: Behavior normal.     Lab Results  Component Value Date   WBC 4.9 01/30/2021   HGB 15.5 01/30/2021   HCT 45.8 01/30/2021   PLT 193 01/30/2021   GLUCOSE 184 (H) 08/14/2023   CHOL 135 08/14/2023   TRIG 87 08/14/2023   HDL 40 08/14/2023   LDLCALC 78 08/14/2023   ALT 24 08/14/2023   AST 20 08/14/2023   NA 139 08/14/2023   K 4.9 08/14/2023   CL 100 08/14/2023   CREATININE 0.71 (L) 08/14/2023   BUN 13 08/14/2023   CO2 26 08/14/2023   TSH 2.990 08/14/2023   PSA 2.23 10/20/2014   HGBA1C 7.8 (A) 12/26/2023   MICROALBUR 3.6 (H) 10/20/2014     Assessment & Plan:  Essential hypertension, benign Assessment & Plan: BP stable.  Continue enalapril.   Type 2 diabetes mellitus without complication, without long-term current use of insulin (HCC) Assessment & Plan: Following with endocrinology.  Needs better control.  Orders: -     Microalbumin / creatinine urine ratio  Encounter for screening colonoscopy -     Ambulatory referral to Gastroenterology  Mixed hyperlipidemia Assessment & Plan: Would like better control of LDL.  Patient will continue dietary/lifestyle changes.  Continue Lipitor.     Follow-up:  6 months  Korbyn Chopin Adriana Simas DO [facility]

## 2023-12-31 ENCOUNTER — Encounter: Payer: Self-pay | Admitting: *Deleted

## 2024-01-01 ENCOUNTER — Encounter: Payer: Self-pay | Admitting: Family Medicine

## 2024-01-01 ENCOUNTER — Ambulatory Visit (HOSPITAL_COMMUNITY)
Admission: RE | Admit: 2024-01-01 | Discharge: 2024-01-01 | Disposition: A | Discharge status: Home / Self Care | Payer: BC Managed Care – PPO | Source: Ambulatory Visit | Attending: "Endocrinology | Admitting: "Endocrinology

## 2024-01-01 DIAGNOSIS — E041 Nontoxic single thyroid nodule: Secondary | ICD-10-CM | POA: Diagnosis not present

## 2024-01-01 DIAGNOSIS — E042 Nontoxic multinodular goiter: Secondary | ICD-10-CM | POA: Diagnosis not present

## 2024-01-01 LAB — SPECIMEN STATUS REPORT

## 2024-01-01 LAB — MICROALBUMIN / CREATININE URINE RATIO
Creatinine, Urine: 73.1 mg/dL
Microalb/Creat Ratio: 20 mg/g{creat} (ref 0–29)
Microalbumin, Urine: 14.4 ug/mL

## 2024-02-27 DIAGNOSIS — R6889 Other general symptoms and signs: Secondary | ICD-10-CM | POA: Diagnosis not present

## 2024-03-03 DIAGNOSIS — M545 Low back pain, unspecified: Secondary | ICD-10-CM | POA: Diagnosis not present

## 2024-03-29 DIAGNOSIS — R6889 Other general symptoms and signs: Secondary | ICD-10-CM | POA: Diagnosis not present

## 2024-04-13 DIAGNOSIS — E119 Type 2 diabetes mellitus without complications: Secondary | ICD-10-CM | POA: Diagnosis not present

## 2024-04-13 DIAGNOSIS — E038 Other specified hypothyroidism: Secondary | ICD-10-CM | POA: Diagnosis not present

## 2024-04-14 LAB — COMPREHENSIVE METABOLIC PANEL WITH GFR
ALT: 15 IU/L (ref 0–44)
AST: 14 IU/L (ref 0–40)
Albumin: 4.4 g/dL (ref 3.9–4.9)
Alkaline Phosphatase: 62 IU/L (ref 44–121)
BUN/Creatinine Ratio: 15 (ref 10–24)
BUN: 10 mg/dL (ref 8–27)
Bilirubin Total: 0.6 mg/dL (ref 0.0–1.2)
CO2: 24 mmol/L (ref 20–29)
Calcium: 9.5 mg/dL (ref 8.6–10.2)
Chloride: 102 mmol/L (ref 96–106)
Creatinine, Ser: 0.65 mg/dL — ABNORMAL LOW (ref 0.76–1.27)
Globulin, Total: 2.2 g/dL (ref 1.5–4.5)
Glucose: 184 mg/dL — ABNORMAL HIGH (ref 70–99)
Potassium: 4.5 mmol/L (ref 3.5–5.2)
Sodium: 141 mmol/L (ref 134–144)
Total Protein: 6.6 g/dL (ref 6.0–8.5)
eGFR: 107 mL/min/{1.73_m2} (ref >59)

## 2024-04-14 LAB — LIPID PANEL
Chol/HDL Ratio: 3.3 ratio (ref 0.0–5.0)
Cholesterol, Total: 149 mg/dL (ref 100–199)
HDL: 45 mg/dL (ref >39)
LDL Chol Calc (NIH): 85 mg/dL (ref 0–99)
Triglycerides: 102 mg/dL (ref 0–149)
VLDL Cholesterol Cal: 19 mg/dL (ref 5–40)

## 2024-04-14 LAB — T4, FREE: Free T4: 1.51 ng/dL (ref 0.82–1.77)

## 2024-04-14 LAB — TSH: TSH: 2.12 u[IU]/mL (ref 0.450–4.500)

## 2024-04-24 ENCOUNTER — Ambulatory Visit (INDEPENDENT_AMBULATORY_CARE_PROVIDER_SITE_OTHER): Payer: BC Managed Care – PPO | Admitting: "Endocrinology

## 2024-04-24 ENCOUNTER — Encounter: Payer: Self-pay | Admitting: "Endocrinology

## 2024-04-24 VITALS — BP 108/62 | HR 68 | Ht 70.0 in | Wt 161.4 lb

## 2024-04-24 DIAGNOSIS — Z7984 Long term (current) use of oral hypoglycemic drugs: Secondary | ICD-10-CM

## 2024-04-24 DIAGNOSIS — E782 Mixed hyperlipidemia: Secondary | ICD-10-CM | POA: Diagnosis not present

## 2024-04-24 DIAGNOSIS — E038 Other specified hypothyroidism: Secondary | ICD-10-CM | POA: Diagnosis not present

## 2024-04-24 DIAGNOSIS — E042 Nontoxic multinodular goiter: Secondary | ICD-10-CM

## 2024-04-24 DIAGNOSIS — E119 Type 2 diabetes mellitus without complications: Secondary | ICD-10-CM | POA: Diagnosis not present

## 2024-04-24 DIAGNOSIS — I10 Essential (primary) hypertension: Secondary | ICD-10-CM | POA: Diagnosis not present

## 2024-04-24 LAB — POCT GLYCOSYLATED HEMOGLOBIN (HGB A1C): HbA1c, POC (controlled diabetic range): 7.5 % — AB (ref 0.0–7.0)

## 2024-04-24 MED ORDER — GLIPIZIDE ER 2.5 MG PO TB24: 2.5000 mg | ORAL_TABLET | Freq: Every day | ORAL | 1 refills | Status: DC

## 2024-04-24 NOTE — Progress Notes (Signed)
 04/24/2024, 8:44 AM  Endocrinology follow-up note   Subjective:    Patient ID: Grant Torres, male    DOB: 1961/02/16.  Grant Torres is being seen in follow-up after he was seen in consultation for management of currently uncontrolled asymptomatic diabetes requested by  Cook, Jayce G, DO.   Past Medical History:  Diagnosis Date   Diabetes mellitus without complication (HCC)    Hyperlipidemia    Hypertension    Microproteinuria     Past Surgical History:  Procedure Laterality Date   CERVICAL SPINE SURGERY  11/25/2015   COLONOSCOPY N/A 04/10/2018   Procedure: COLONOSCOPY;  Surgeon: Golda Claudis PENNER, MD;  Location: [facility];  Service: Endoscopy;  Laterality: N/A;  200   POLYPECTOMY  04/10/2018   Procedure: POLYPECTOMY;  Surgeon: Golda Claudis PENNER, MD;  Location: [facility];  Service: Endoscopy;;  colon   VASECTOMY     WISDOM TOOTH EXTRACTION      Social History   Socioeconomic History   Marital status: Married    Spouse name: Not on file   Number of children: Not on file   Years of education: Not on file   Highest education level: Some college, no degree  Occupational History   Not on file  Tobacco Use   Smoking status: Never   Smokeless tobacco: Never  Vaping Use   Vaping status: Never Used  Substance and Sexual Activity   Alcohol use: Not on file    Comment: occasionally   Drug use: Never   Sexual activity: Not on file  Other Topics Concern   Not on file  Social History Narrative   Not on file   Social Drivers of Health   Financial Resource Strain: Low Risk  (12/26/2023)   Overall Financial Resource Strain (CARDIA)    Difficulty of Paying Living Expenses: Not very hard  Food Insecurity: No Food Insecurity (12/26/2023)   Hunger Vital Sign    Worried About Running Out of Food in the Last Year: Never true    Ran Out of Food in the Last Year: Never true   Transportation Needs: No Transportation Needs (12/26/2023)   PRAPARE - Administrator, Civil Service (Medical): No    Lack of Transportation (Non-Medical): No  Physical Activity: Insufficiently Active (12/26/2023)   Exercise Vital Sign    Days of Exercise per Week: 1 day    Minutes of Exercise per Session: 20 min  Stress: No Stress Concern Present (12/26/2023)   [employer] of Occupational Health - Occupational Stress Questionnaire    Feeling of Stress : Only a little  Social Connections: Unknown (12/26/2023)   Social Connection and Isolation Panel    Frequency of Communication with Friends and Family: Patient declined    Frequency of Social Gatherings with Friends and Family: Patient declined    Attends Religious Services: More than 4 times per year    Active Member of [employer] or Organizations: No    Attends Engineer, structural: Not on file    Marital Status: Married    Family History  Problem Relation  Age of Onset   Kidney disease Mother    Diabetes Mother    Heart attack Mother    Stroke Mother    Heart failure Mother    Heart attack Father    Hypertension Sister    Colon cancer Neg Hx    Thyroid  disease Neg Hx     Outpatient Encounter Medications as of 04/24/2024  Medication Sig   glipiZIDE  (GLUCOTROL  XL) 2.5 MG 24 hr tablet Take 1 tablet (2.5 mg total) by mouth daily with breakfast.   ACCU-CHEK GUIDE test strip USE 1 TWICE DAILY TO CHECK BLOOD GLUCOSE   Accu-Chek Softclix Lancets lancets USE 1 LANCET TO CHECK GLUCOSE TWICE DAILY   aspirin EC 81 MG tablet Take 81 mg by mouth daily.   atorvastatin  (LIPITOR) 10 MG tablet Take 1/2 tab p.o. daily. (Patient taking differently: Take 1/4 tab p.o. daily.)   blood glucose meter kit and supplies KIT Dispense based on patient and insurance preference. Use to test blood sugar twice daily. ICD 10 code E11.9   empagliflozin  (JARDIANCE ) 25 MG TABS tablet Take 1 tablet (25 mg total) by mouth daily before breakfast.    enalapril  (VASOTEC ) 10 MG tablet Take 1 tablet by mouth once daily (Patient taking differently: Take 0.5 tablets by mouth daily. Take 1 tablet by mouth once daily)   levothyroxine  (SYNTHROID ) 50 MCG tablet Take 1 tablet (50 mcg total) by mouth daily before breakfast.   metFORMIN  (GLUCOPHAGE -XR) 500 MG 24 hr tablet Take 1 tablet (500 mg total) by mouth 2 (two) times daily with a meal.   sildenafil  (VIAGRA ) 25 MG tablet Take 1 tablet (25 mg total) by mouth daily as needed for erectile dysfunction.   No facility-administered encounter medications on file as of 04/24/2024.    ALLERGIES: No Known Allergies  VACCINATION STATUS: Immunization History  Administered Date(s) Administered   Influenza, Quadrivalent, Recombinant, Inj, Pf 08/04/2019   Influenza,inj,Quad PF,6+ Mos 10/26/2015, 08/15/2018   Influenza-Unspecified 08/05/2013, 07/26/2016, 07/22/2020, 10/10/2021, 11/24/2022   PFIZER(Purple Top)SARS-COV-2 Vaccination 01/03/2020, 01/24/2020   Pneumococcal-Unspecified 11/13/2002   Tdap 11/26/2018    Diabetes He presents for his follow-up diabetic visit. He has type 2 diabetes mellitus. Onset time: He was diagnosed at approximate age of 36 years. His disease course has been fluctuating. There are no hypoglycemic associated symptoms. Pertinent negatives for hypoglycemia include no confusion, headaches, pallor or seizures. There are no diabetic associated symptoms. Pertinent negatives for diabetes include no chest pain, no fatigue, no polydipsia, no polyphagia, no polyuria and no weakness. There are no hypoglycemic complications. Symptoms are stable. Diabetic complications include impotence. Risk factors for coronary artery disease include diabetes mellitus, dyslipidemia, hypertension, male sex and family history. Current diabetic treatments: He is currently on metformin  500 mg p.o. twice daily, Jardiance  25 mg p.o. once a day. His weight is fluctuating minimally (Patient reports that historically he  weighed as high as 211 pounds, currently 161 pounds.). He has not had a previous visit with a dietitian. He participates in exercise intermittently. His home blood glucose trend is fluctuating minimally. His breakfast blood glucose range is generally 140-180 mg/dl. His overall blood glucose range is 140-180 mg/dl. (He presents with improving, however still slightly above target glycemic profile averaging 159 fasting blood glucose readings for the last 30 days.  His point-of-care A1c is 7.5%.  He did not document or report hypoglycemia.   He continues to tolerate Jardiance  and metformin .      ) An ACE inhibitor/angiotensin II receptor blocker is being taken.  Hyperlipidemia This is a chronic problem. The current episode started more than 1 year ago. The problem is controlled. Exacerbating diseases include diabetes. Pertinent negatives include no chest pain, myalgias or shortness of breath. Risk factors for coronary artery disease include diabetes mellitus, dyslipidemia, family history, hypertension and male sex.  Hypertension This is a chronic problem. The current episode started more than 1 year ago. Pertinent negatives include no chest pain, headaches, neck pain, palpitations or shortness of breath. Risk factors for coronary artery disease include dyslipidemia, diabetes mellitus and family history. Past treatments include ACE inhibitors. Identifiable causes of hypertension include a thyroid  problem.  Thyroid  Problem Presents for initial visit. Onset time: Patient is known to have multinodular goiter between 2017-2019 and thyroid  sonograms.  He also has multiple nodules in his thyroid .  1 nodule was biopsied with benign outcomes. Patient reports no constipation, diarrhea, fatigue or palpitations. Past treatments include levothyroxine . Prior procedures include thyroid  FNA. The following procedures have not been performed: radioiodine uptake scan and thyroidectomy. His past medical history is significant for  diabetes and hyperlipidemia.     Objective:       04/24/2024    8:18 AM 12/30/2023    8:12 AM 12/26/2023    8:08 AM  Vitals with BMI  Height 5' 10 5' 10 5' 10  Weight 161 lbs 6 oz 160 lbs 13 oz 163 lbs  BMI 23.16 23.07 23.39  Systolic 108 103 873  Diastolic 62 63 68  Pulse 68 70 76    BP 108/62   Pulse 68   Ht 5' 10 (1.778 m)   Wt 161 lb 6.4 oz (73.2 kg)   BMI 23.16 kg/m   Wt Readings from Last 3 Encounters:  04/24/24 161 lb 6.4 oz (73.2 kg)  12/30/23 160 lb 12.8 oz (72.9 kg)  12/26/23 163 lb (73.9 kg)      CMP ( most recent) CMP     Component Value Date/Time   NA 141 04/13/2024 0912   K 4.5 04/13/2024 0912   CL 102 04/13/2024 0912   CO2 24 04/13/2024 0912   GLUCOSE 184 (H) 04/13/2024 0912   GLUCOSE 224 (H) 10/20/2014 0937   BUN 10 04/13/2024 0912   CREATININE 0.65 (L) 04/13/2024 0912   CREATININE 0.77 10/20/2014 0937   CALCIUM  9.5 04/13/2024 0912   PROT 6.6 04/13/2024 0912   ALBUMIN 4.4 04/13/2024 0912   AST 14 04/13/2024 0912   ALT 15 04/13/2024 0912   ALKPHOS 62 04/13/2024 0912   BILITOT 0.6 04/13/2024 0912   GFRNONAA 103 08/01/2020 0904   GFRAA 120 08/01/2020 0904     Diabetic Labs (most recent): Lab Results  Component Value Date   HGBA1C 7.5 (A) 04/24/2024   HGBA1C 7.8 (A) 12/26/2023   HGBA1C 7.5 (A) 08/22/2023   MICROALBUR 3.6 (H) 10/20/2014   MICROALBUR 1.99 (H) 09/01/2013     Lipid Panel ( most recent) Lipid Panel     Component Value Date/Time   CHOL 149 04/13/2024 0912   TRIG 102 04/13/2024 0912   HDL 45 04/13/2024 0912   CHOLHDL 3.3 04/13/2024 0912   CHOLHDL 2.9 10/20/2014 0937   VLDL 20 10/20/2014 0937   LDLCALC 85 04/13/2024 0912   LABVLDL 19 04/13/2024 0912      Lab Results  Component Value Date   TSH 2.120 04/13/2024   TSH 2.990 08/14/2023   TSH 4.130 05/30/2022   TSH 2.22 08/16/2021   TSH 2.66 08/16/2020   TSH 2.710 09/18/2019   TSH 2.76  08/11/2018   TSH 3.880 01/13/2016   FREET4 1.51 04/13/2024   FREET4 1.66  08/14/2023   FREET4 1.39 05/30/2022   FREET4 1.10 08/16/2021   FREET4 1.14 08/16/2020   FREET4 1.65 09/18/2019   FREET4 1.05 08/11/2018       Assessment & Plan:   1. Type 2 diabetes mellitus without complication, without long-term current use of insulin (HCC)  - Grant Torres has currently uncontrolled symptomatic type 2 DM since  63 years of age.  He presents with improving, however still slightly above target glycemic profile averaging 159 fasting blood glucose readings for the last 30 days.  His point-of-care A1c is 7.5%.  He did not document or report hypoglycemia.   He continues to tolerate Jardiance  and metformin .      He presents with significant improvement in his glycemic profile.  Recent labs reviewed. - I had a long discussion with him about the progressive nature of diabetes and the pathology behind its complications.  -He does not report any gross complications from his diabetes, however he has comorbid conditions of hyperlipidemia, hypertension, and patient remains at a high risk for more acute and chronic complications which include CAD, CVA, CKD, retinopathy, and neuropathy. These are all discussed in detail with him.  - he acknowledges that there is a room for improvement in his food and drink choices. - Suggestion is made for him to avoid simple carbohydrates  from his diet including Cakes, Sweet Desserts, Ice Cream, Soda (diet and regular), Sweet Tea, Candies, Chips, Cookies, Store Bought Juices, Alcohol , Artificial Sweeteners,  Coffee Creamer, and Sugar-free Products, Lemonade. This will help patient to have more stable blood glucose profile and potentially avoid unintended weight gain.  The following Lifestyle Medicine recommendations according to American College of Lifestyle Medicine  [facility]) were discussed and and offered to patient and he  agrees to start the journey:  A. Whole Foods, Plant-Based Nutrition comprising of fruits and vegetables, plant-based  proteins, whole-grain carbohydrates was discussed in detail with the patient.   A list for source of those nutrients were also provided to the patient.  Patient will use only water  or unsweetened tea for hydration. B.  The need to stay away from risky substances including alcohol, smoking; obtaining 7 to 9 hours of restorative sleep, at least 150 minutes of moderate intensity exercise weekly, the importance of healthy social connections,  and stress management techniques were discussed. C.  A full color page of  Calorie density of various food groups per pound showing examples of each food groups was provided to the patient.   - he is scheduled with Penny Crumpton, RDN, CDE for individualized diabetes education.  - I have approached him with the following plan to manage  his diabetes and patient agrees:   -He presents with slightly above target glycemic profile .  He continues to tolerate his current medications including Jardiance  25 mg p.o. daily at breakfast, and metformin  500 mg p.o. twice daily.   - He is not a candidate for weight loss medications.  He is not a candidate for insulin intervention either, may benefit from low-dose glipizide .  I discussed and initiated glipizide  2.5 mg XL p.o. daily at breakfast.    He is advised to continue monitoring blood glucose twice a day-daily before breakfast and at bedtime.   - he is encouraged to call clinic for blood glucose levels less than 70 or above 200 mg /dl. He is glutamic acid decarboxylase antibodies as well as  IA 2 antibodies were negative.     - Specific targets for  A1c;  LDL, HDL,  and Triglycerides were discussed with the patient.  2) Blood Pressure /Hypertension:  His blood pressure is controlled to target.  He is advised to lower his enalapril  to 5 mg p.o. daily at breakfast   3) Lipids/Hyperlipidemia:   Review of his recent lipid panel showed controlled LDL at 85.  He is advised to continue lovastatin 10 mg p.o. daily at  bedtime.    Side effects and precautions discussed with him.     4)  Weight/Diet:  Body mass index is 23.16 kg/m.  -   He is not a candidate for weight loss.  The above detailed  ACLM recommendations for nutrition, exercise, sleep, social life, avoidance of risky substances, the need for restorative sleep   information will also detailed on discharge instructions.   5) hypothyroidism: His thyroid  function tests are consistent with appropriate replacement.  He is advised to continue levothyroxine  50 mcg p.o. daily before breakfast.     - We discussed about the correct intake of his thyroid  hormone, on empty stomach at fasting, with water , separated by at least 30 minutes from breakfast and other medications,  and separated by more than 4 hours from calcium , iron, multivitamins, acid reflux medications (PPIs). -Patient is made aware of the fact that thyroid  hormone replacement is needed for life, dose to be adjusted by periodic monitoring of thyroid  function tests.  He did have a negative fine-needle aspiration biopsy of a thyroid  nodule in March 2017.  6) multinodular goiter: Review of his ultrasound between 2017-2019 documented multinodular goiter.  He does have one of his nodules biopsied with benign outcomes.   He will have a surveillance thyroid  ultrasound before his next visit.   7) Chronic Care/Health Maintenance:  -he  is on ACEI/ARB and Statin medications and  is encouraged to initiate and continue to follow up with Ophthalmology, Dentist,  Podiatrist at least yearly or according to recommendations, and advised to   stay away from smoking. I have recommended yearly flu vaccine and pneumonia vaccine at least every 5 years; moderate intensity exercise for up to 150 minutes weekly; and  sleep for 7- 9 hours a day.  Regarding his concern the ED : He may benefit from low-dose Viagra .  A prescription for Viagra  25 mg p.o. as needed was given to him during his last visit.  - he is  advised  to maintain close follow up with Cook, Jayce G, DO for primary care needs, as well as his other providers for optimal and coordinated care.   I spent  26  minutes in the care of the patient today including review of labs from CMP, Lipids, Thyroid  Function, Hematology (current and previous including abstractions from other facilities); face-to-face time discussing  his blood glucose readings/logs, discussing hypoglycemia and hyperglycemia episodes and symptoms, medications doses, his options of short and long term treatment based on the latest standards of care / guidelines;  discussion about incorporating lifestyle medicine;  and documenting the encounter. Risk reduction counseling performed per USPSTF guidelines to reduce  cardiovascular risk factors.     Please refer to Patient Instructions for Blood Glucose Monitoring and Insulin/Medications Dosing Guide  in media tab for additional information. Please  also refer to  Patient Self Inventory in the Media  tab for reviewed elements of pertinent patient history.  Grant Torres participated in the discussions, expressed understanding, and voiced agreement with the  above plans.  All questions were answered to his satisfaction. he is encouraged to contact clinic should he have any questions or concerns prior to his return visit.   Follow up plan: - Return in about 4 months (around 08/24/2024) for Bring Meter/CGM Device/Logs- A1c in Office.  Ranny Earl, MD [facility] [address] [place], [place] [address] Phone: [phone]  Fax: [phone]    04/24/2024, 8:44 AM  This note was partially dictated with voice recognition software. Similar sounding words can be transcribed inadequately or may not  be corrected upon review.

## 2024-04-24 NOTE — Patient Instructions (Signed)

## 2024-04-28 DIAGNOSIS — R6889 Other general symptoms and signs: Secondary | ICD-10-CM | POA: Diagnosis not present

## 2024-04-30 ENCOUNTER — Other Ambulatory Visit: Payer: Self-pay | Admitting: "Endocrinology

## 2024-05-06 ENCOUNTER — Encounter (HOSPITAL_COMMUNITY): Payer: Self-pay

## 2024-05-06 ENCOUNTER — Other Ambulatory Visit: Payer: Self-pay

## 2024-05-06 ENCOUNTER — Ambulatory Visit: Admitting: Physician Assistant

## 2024-05-06 ENCOUNTER — Emergency Department (HOSPITAL_COMMUNITY)
Admission: EM | Admit: 2024-05-06 | Discharge: 2024-05-06 | Disposition: A | Discharge status: Home / Self Care | Attending: Emergency Medicine | Admitting: Emergency Medicine

## 2024-05-06 ENCOUNTER — Emergency Department (HOSPITAL_COMMUNITY)

## 2024-05-06 ENCOUNTER — Ambulatory Visit: Payer: Self-pay

## 2024-05-06 DIAGNOSIS — R42 Dizziness and giddiness: Secondary | ICD-10-CM | POA: Insufficient documentation

## 2024-05-06 DIAGNOSIS — I6782 Cerebral ischemia: Secondary | ICD-10-CM | POA: Diagnosis not present

## 2024-05-06 DIAGNOSIS — E871 Hypo-osmolality and hyponatremia: Secondary | ICD-10-CM | POA: Insufficient documentation

## 2024-05-06 DIAGNOSIS — Z8673 Personal history of transient ischemic attack (TIA), and cerebral infarction without residual deficits: Secondary | ICD-10-CM | POA: Diagnosis not present

## 2024-05-06 LAB — CBC WITH DIFFERENTIAL/PLATELET
Abs Immature Granulocytes: 0.01 K/uL (ref 0.00–0.07)
Basophils Absolute: 0 K/uL (ref 0.0–0.1)
Basophils Relative: 1 %
Eosinophils Absolute: 0 K/uL (ref 0.0–0.5)
Eosinophils Relative: 1 %
HCT: 43.9 % (ref 39.0–52.0)
Hemoglobin: 15.4 g/dL (ref 13.0–17.0)
Immature Granulocytes: 0 %
Lymphocytes Relative: 23 %
Lymphs Abs: 0.9 K/uL (ref 0.7–4.0)
MCH: 30.3 pg (ref 26.0–34.0)
MCHC: 35.1 g/dL (ref 30.0–36.0)
MCV: 86.4 fL (ref 80.0–100.0)
Monocytes Absolute: 0.3 K/uL (ref 0.1–1.0)
Monocytes Relative: 9 %
Neutro Abs: 2.7 K/uL (ref 1.7–7.7)
Neutrophils Relative %: 66 %
Platelets: 148 K/uL — ABNORMAL LOW (ref 150–400)
RBC: 5.08 MIL/uL (ref 4.22–5.81)
RDW: 12.6 % (ref 11.5–15.5)
WBC: 4 K/uL (ref 4.0–10.5)
nRBC: 0 % (ref 0.0–0.2)

## 2024-05-06 LAB — URINALYSIS, ROUTINE W REFLEX MICROSCOPIC
Bacteria, UA: NONE SEEN
Bilirubin Urine: NEGATIVE
Glucose, UA: >=500 mg/dL — AB
Hgb urine dipstick: NEGATIVE
Ketones, ur: 80 mg/dL — AB
Leukocytes,Ua: NEGATIVE
Nitrite: NEGATIVE
Protein, ur: NEGATIVE mg/dL
Specific Gravity, Urine: 1.029 (ref 1.005–1.030)
pH: 5 (ref 5.0–8.0)

## 2024-05-06 LAB — COMPREHENSIVE METABOLIC PANEL WITH GFR
ALT: 22 U/L (ref 0–44)
AST: 23 U/L (ref 15–41)
Albumin: 4 g/dL (ref 3.5–5.0)
Alkaline Phosphatase: 53 U/L (ref 38–126)
Anion gap: 17 — ABNORMAL HIGH (ref 5–15)
BUN: 23 mg/dL (ref 8–23)
CO2: 23 mmol/L (ref 22–32)
Calcium: 9.4 mg/dL (ref 8.9–10.3)
Chloride: 94 mmol/L — ABNORMAL LOW (ref 98–111)
Creatinine, Ser: 0.57 mg/dL — ABNORMAL LOW (ref 0.61–1.24)
GFR, Estimated: >60 mL/min (ref >60)
Glucose, Bld: 112 mg/dL — ABNORMAL HIGH (ref 70–99)
Potassium: 3.7 mmol/L (ref 3.5–5.1)
Sodium: 134 mmol/L — ABNORMAL LOW (ref 135–145)
Total Bilirubin: 1.7 mg/dL — ABNORMAL HIGH (ref 0.0–1.2)
Total Protein: 7.4 g/dL (ref 6.5–8.1)

## 2024-05-06 LAB — CBG MONITORING, ED
Glucose-Capillary: 128 mg/dL — ABNORMAL HIGH (ref 70–99)
Glucose-Capillary: 132 mg/dL — ABNORMAL HIGH (ref 70–99)

## 2024-05-06 MED ORDER — DIPHENHYDRAMINE HCL 50 MG/ML IJ SOLN
25.0000 mg | Freq: Once | INTRAMUSCULAR | Status: AC
  Administered 2024-05-06: 25 mg via INTRAVENOUS
  Filled 2024-05-06: qty 1

## 2024-05-06 MED ORDER — METHYLPREDNISOLONE SODIUM SUCC 125 MG IJ SOLR
125.0000 mg | Freq: Once | INTRAMUSCULAR | Status: AC
  Administered 2024-05-06: 125 mg via INTRAVENOUS
  Filled 2024-05-06: qty 2

## 2024-05-06 MED ORDER — KETOROLAC TROMETHAMINE 15 MG/ML IJ SOLN
15.0000 mg | Freq: Once | INTRAMUSCULAR | Status: DC
  Filled 2024-05-06: qty 1

## 2024-05-06 MED ORDER — MECLIZINE HCL 25 MG PO TABS: 25.0000 mg | ORAL_TABLET | Freq: Three times a day (TID) | ORAL | 0 refills | Status: DC | PRN

## 2024-05-06 MED ORDER — LORAZEPAM 1 MG PO TABS
1.0000 mg | ORAL_TABLET | Freq: Once | ORAL | Status: AC
  Administered 2024-05-06: 1 mg via ORAL
  Filled 2024-05-06: qty 1

## 2024-05-06 MED ORDER — MECLIZINE HCL 12.5 MG PO TABS
25.0000 mg | ORAL_TABLET | Freq: Once | ORAL | Status: AC
  Administered 2024-05-06: 25 mg via ORAL
  Filled 2024-05-06: qty 2

## 2024-05-06 MED ORDER — METOCLOPRAMIDE HCL 5 MG/ML IJ SOLN
10.0000 mg | Freq: Once | INTRAMUSCULAR | Status: AC
  Administered 2024-05-06: 10 mg via INTRAVENOUS
  Filled 2024-05-06: qty 2

## 2024-05-06 MED ORDER — ONDANSETRON 4 MG PO TBDP
4.0000 mg | ORAL_TABLET | Freq: Once | ORAL | Status: AC
  Administered 2024-05-06: 4 mg via ORAL
  Filled 2024-05-06: qty 1

## 2024-05-06 MED ORDER — LACTATED RINGERS IV BOLUS
1000.0000 mL | Freq: Once | INTRAVENOUS | Status: AC
  Administered 2024-05-06: 1000 mL via INTRAVENOUS

## 2024-05-06 NOTE — ED Provider Notes (Signed)
 [place] EMERGENCY DEPARTMENT AT [facility] Provider Note   CSN: [id number] Arrival date & time: 05/06/24  1445     Patient presents with: Dizziness   Grant Torres is a 63 y.o. male.  He has history of high cholesterol, diabetes, hypothyroidism and hypertension.  Presents the ER complaining of vertigo x 4 days, he has had it several times over the past year but this is the worst and today he was off balance, unable to walk per his wife and mall almost fell over twice.  Denies head injury or loss of consciousness.  He did have a headache with this over the weekend but thought it was due to overexerting himself in the heat.  He denies any fever or chills, no neck pain, no numbness no weakness or speech changes.  He does note that he has family history of Mnire's disease.    Dizziness      Prior to Admission medications   Medication Sig Start Date End Date Taking? Authorizing Provider  ACCU-CHEK GUIDE test strip USE 1 TWICE DAILY TO CHECK BLOOD GLUCOSE 07/19/22   Nida, Gebreselassie W, MD  Accu-Chek Softclix Lancets lancets USE 1 LANCET TO CHECK GLUCOSE TWICE DAILY 10/10/22   Nida, Gebreselassie W, MD  aspirin EC 81 MG tablet Take 81 mg by mouth daily.    [provider]  atorvastatin  (LIPITOR) 10 MG tablet Take 1 tablet (10 mg total) by mouth at bedtime. 04/30/24   Nida, Gebreselassie W, MD  blood glucose meter kit and supplies KIT Dispense based on patient and insurance preference. Use to test blood sugar twice daily. ICD 10 code E11.9 06/04/22   Lenis Ethelle ORN, MD  empagliflozin  (JARDIANCE ) 25 MG TABS tablet Take 1 tablet (25 mg total) by mouth daily before breakfast. 12/26/23   Nida, Gebreselassie W, MD  enalapril  (VASOTEC ) 10 MG tablet Take 1 tablet by mouth once daily Patient taking differently: Take 0.5 tablets by mouth daily. Take 1 tablet by mouth once daily 04/18/23   Nida, Gebreselassie W, MD  glipiZIDE  (GLUCOTROL  XL) 2.5 MG 24 hr tablet Take 1 tablet  (2.5 mg total) by mouth daily with breakfast. 04/24/24   Nida, Gebreselassie W, MD  levothyroxine  (SYNTHROID ) 50 MCG tablet Take 1 tablet (50 mcg total) by mouth daily before breakfast. 12/26/23   Nida, Gebreselassie W, MD  metFORMIN  (GLUCOPHAGE -XR) 500 MG 24 hr tablet Take 1 tablet (500 mg total) by mouth 2 (two) times daily with a meal. 12/26/23   Nida, Ethelle ORN, MD  sildenafil  (VIAGRA ) 25 MG tablet Take 1 tablet (25 mg total) by mouth daily as needed for erectile dysfunction. 06/26/23   Cook, Jayce G, DO    Allergies: Patient has no known allergies.    Review of Systems  Neurological:  Positive for dizziness.    Updated Vital Signs BP 133/74 (BP Location: Right Arm)   Pulse 76   Temp 98.1 F (36.7 C) (Oral)   Resp 18   Ht 5' 10 (1.778 m)   Wt 73 kg   SpO2 100%   BMI 23.10 kg/m   Physical Exam Vitals and nursing note reviewed.  Constitutional:      General: He is not in acute distress.    Appearance: He is well-developed.  HENT:     Head: Normocephalic and atraumatic.     Right Ear: Tympanic membrane normal.     Left Ear: Tympanic membrane normal.     Mouth/Throat:     Mouth: Mucous membranes  are moist.  Eyes:     Conjunctiva/sclera: Conjunctivae normal.  Cardiovascular:     Rate and Rhythm: Normal rate and regular rhythm.     Heart sounds: No murmur heard. Pulmonary:     Effort: Pulmonary effort is normal. No respiratory distress.     Breath sounds: Normal breath sounds.  Abdominal:     Palpations: Abdomen is soft.     Tenderness: There is no abdominal tenderness.  Musculoskeletal:        General: No swelling.     Cervical back: Neck supple.  Skin:    General: Skin is warm and dry.     Capillary Refill: Capillary refill takes less than 2 seconds.  Neurological:     General: No focal deficit present.     Mental Status: He is alert and oriented to person, place, and time.     Cranial Nerves: No cranial nerve deficit.     Sensory: No sensory deficit.      Motor: No weakness.     Comments: Ataxic gait  Psychiatric:        Mood and Affect: Mood normal.     (all labs ordered are listed, but only abnormal results are displayed) Labs Reviewed  COMPREHENSIVE METABOLIC PANEL WITH GFR - Abnormal; Notable for the following components:      Result Value   Sodium 134 (*)    Chloride 94 (*)    Glucose, Bld 112 (*)    Creatinine, Ser 0.57 (*)    Total Bilirubin 1.7 (*)    Anion gap 17 (*)    All other components within normal limits  CBC WITH DIFFERENTIAL/PLATELET - Abnormal; Notable for the following components:   Platelets 148 (*)    All other components within normal limits  CBG MONITORING, ED - Abnormal; Notable for the following components:   Glucose-Capillary 128 (*)    All other components within normal limits  URINALYSIS, ROUTINE W REFLEX MICROSCOPIC    EKG: None  Radiology: No results found.   Procedures   Medications Ordered in the ED  LORazepam  (ATIVAN ) tablet 1 mg (has no administration in time range)  meclizine  (ANTIVERT ) tablet 25 mg (has no administration in time range)  ondansetron  (ZOFRAN -ODT) disintegrating tablet 4 mg (has no administration in time range)                                    Medical Decision Making Differential diagnosis includes but limited to benign paroxysmal positional vertigo, Mnire's disease labyrinthitis, cerebellar stroke, other  Course: Patient has been having 3 days of persistent vertigo with intermittent headaches, he has had vertigo on and off for the past year but this has been the worst and he has been having ataxic gait at home, almost fell twice today so he noted to be evaluated.  He has no other focal neurologic exam findings but when he tries to ambulate he does lose his balance.  An MRI of his brain.  There is no evidence of stroke on my interpretation I agree with radiology read, they also mention some incidental findings of mild paranasal sinus thickening and  atlantooccipital joint effusion on the right  Because of findings with the patient, we discussed there could be an element of cervicogenic vertigo, he states when he has pain in his neck it does seem to be worse in an ice pack to his neck seems to help some.  Going to follow-up with the spine specialist  This time he is still feeling ataxic even after regular treatment with Ativan , meclizine  and Zofran , will do some additional medications and reevaluate, signed out to Madelin Gentry at this time with plan to discharge if he is able to ambulate after medications.  Also recommended follow-up with ENT as he has family history of Mnire's disease  Amount and/or Complexity of Data Reviewed Labs: ordered. Radiology: ordered.  Risk Prescription drug management.        Final diagnoses:  None    ED Discharge Orders     None          Suellen Sherran DELENA DEVONNA 05/06/24 1850    Albertina Dixon, MD 05/07/24 1742

## 2024-05-06 NOTE — ED Notes (Signed)
 Pt ambulated with assistance of nursing staff. Pt stated he feels better than he has all day, but felt some dizziness still lingering. Pt was able to walk down the hallway, but was unsteady on his feet.

## 2024-05-06 NOTE — ED Notes (Signed)
 Patient in MRI at this time.

## 2024-05-06 NOTE — ED Triage Notes (Signed)
 Reports hx of vertigo but has gotten worse and more frequent per wife.  Reports he has fallen 2 times due to dizziness.  Reports had a headache with but headache has subsided.  Denies unilateral weakness +nystagmus

## 2024-05-06 NOTE — Discharge Instructions (Addendum)
 You are seen today for dizziness described as spinning and difficulty with walking due to this.  We are prescribing you medication for this.  Given your family history of Mnire's disease and normal MRI of your brain make sure you follow-up with the ENT doctor.  You also had a finding on your MRI of some fluid in the joint between your first cervical vertebrae and your skull, given your chronic neck pain he should follow-up with the spine specialist as well.  Please do not drive or operate machinery while taking the medication.  I also recommend that you follow-up with your primary care provider.  Return to the emergency department for any new or worsening symptoms.

## 2024-05-06 NOTE — ED Provider Notes (Signed)
   Patient signed out to me by Sherran Barks, PA-C pending reevaluation   Patient has history of hypertension and diabetes.  Here for dizziness.  Dizziness has been persistent for several days.  Has family history of Mnire's disease. he describes his dizziness to me as a sense of movement that worsens with with position change.  Endorsed headache earlier but headache has since subsided.  No vomiting, no visual changes.  Patient had MRI of brain without acute intracranial abnormality  See previous provider note for complete H&P  He received multiple medications during his ER stay.  Somnolent on my exam but easily awakened.  Ambulated in the department earlier with nursing staff and reports improvement of his symptoms but still felt slightly off balance.  I personally ambulated him in the exam room.  He states his symptoms have improved but he feels sleepy.  At this point, I question ataxia versus medication reaction. His vital signs are reassuring.  I do not appreciate any focal neurodeficit on my exam and he ambulated for me with a steady gait. He has received IVF's I have offered hospital admission but patient declined stating that he prefers to go home this evening and he will follow-up outpatient with his PCP.  He was also given follow-up information for neurosurgery and ENT     Grant Torres 05/06/24 2148    Albertina Dixon, MD 05/07/24 1743

## 2024-05-06 NOTE — Telephone Encounter (Signed)
 FYI Only or Action Required?: FYI only for provider.  Patient was last seen in primary care on 12/30/2023 by Cook, Jayce G, DO.  Called Nurse Triage reporting Dizziness.  Symptoms began several days ago.  Interventions attempted: Nothing.  Symptoms are: gradually worsening.  Triage Disposition: Go to ED or PCP/Alternative with Approval  Patient/caregiver understands and will follow disposition?: Yes, will follow disposition  Copied from CRM [phone]. Topic: Clinical - Red Word Triage >> May 06, 2024  8:20 AM Grant Torres wrote: Red Word that prompted transfer to Nurse Triage: Patient has been experiencing dizziness for 4 days. Has had a headache and staggering when on his feet. Reason for Disposition  Patient sounds very sick or weak to the triager  Answer Assessment - Initial Assessment Questions 1. DESCRIPTION: Describe your dizziness.  Caller is the wife and states that he is walking around like he is drunk, c/o dizziness, caller states that he staggers when walking. Pt has been missing work, caller states this is unusual. Caller reports that pt is having mild labored breathing, and is also pale in color. Pt has a HA.  Protocols used: Dizziness - Lightheadedness-A-AH

## 2024-05-29 DIAGNOSIS — R6889 Other general symptoms and signs: Secondary | ICD-10-CM | POA: Diagnosis not present

## 2024-06-08 ENCOUNTER — Other Ambulatory Visit: Payer: Self-pay | Admitting: Family Medicine

## 2024-06-08 ENCOUNTER — Encounter: Payer: Self-pay | Admitting: Family Medicine

## 2024-06-08 DIAGNOSIS — R42 Dizziness and giddiness: Secondary | ICD-10-CM

## 2024-07-01 ENCOUNTER — Encounter: Payer: Self-pay | Admitting: Family Medicine

## 2024-07-01 ENCOUNTER — Ambulatory Visit: Admitting: Family Medicine

## 2024-07-01 ENCOUNTER — Encounter (INDEPENDENT_AMBULATORY_CARE_PROVIDER_SITE_OTHER): Payer: Self-pay

## 2024-07-01 VITALS — BP 116/76 | HR 69 | Ht 70.0 in | Wt 164.0 lb

## 2024-07-01 DIAGNOSIS — Z23 Encounter for immunization: Secondary | ICD-10-CM

## 2024-07-01 DIAGNOSIS — E02 Subclinical iodine-deficiency hypothyroidism: Secondary | ICD-10-CM | POA: Insufficient documentation

## 2024-07-01 DIAGNOSIS — R42 Dizziness and giddiness: Secondary | ICD-10-CM | POA: Diagnosis not present

## 2024-07-01 DIAGNOSIS — E041 Nontoxic single thyroid nodule: Secondary | ICD-10-CM | POA: Insufficient documentation

## 2024-07-01 NOTE — Progress Notes (Signed)
 Subjective:  Patient ID: Grant Torres, male    DOB: 1961/06/23  Age: 63 y.o. MRN: [id number]  CC:   Chief Complaint  Patient presents with   Medical Management of Chronic Issues    Still managing virtigo    HPI:  63 year old male presents for evaluation of the above.  Recent bout of vertigo.  Was evaluated in the ER on 7/9.  MRI brain was obtained and was essentially unremarkable.  Patient has requested a referral to neurology.  He has an appointment in October.  Patient is very concerned about this.  He is trying to figure out the underlying etiology.  Patient states that he has had intermittent vertigo since then.  He has had no severe bouts.  Uses meclizine  with improvement.  Patient Active Problem List   Diagnosis Date Noted   Vertigo 04/23/2023   Essential hypertension, benign 12/21/2021   Hypothyroidism 08/18/2019   Multinodular goiter 08/11/2018   Mixed hyperlipidemia 09/01/2013   Diabetes (HCC) 09/01/2013    Social Hx   Social History   Socioeconomic History   Marital status: Married    Spouse name: Not on file   Number of children: Not on file   Years of education: Not on file   Highest education level: Some college, no degree  Occupational History   Not on file  Tobacco Use   Smoking status: Never   Smokeless tobacco: Never  Vaping Use   Vaping status: Never Used  Substance and Sexual Activity   Alcohol use: Yes    Comment: occasionally   Drug use: Never   Sexual activity: Not on file  Other Topics Concern   Not on file  Social History Narrative   Not on file   Social Drivers of Health   Financial Resource Strain: Medium Risk (06/27/2024)   Overall Financial Resource Strain (CARDIA)    Difficulty of Paying Living Expenses: Somewhat hard  Food Insecurity: No Food Insecurity (06/27/2024)   Hunger Vital Sign    Worried About Running Out of Food in the Last Year: Never true    Ran Out of Food in the Last Year: Never true  Transportation Needs: No  Transportation Needs (06/27/2024)   PRAPARE - Administrator, Civil Service (Medical): No    Lack of Transportation (Non-Medical): No  Physical Activity: Insufficiently Active (06/27/2024)   Exercise Vital Sign    Days of Exercise per Week: 2 days    Minutes of Exercise per Session: 20 min  Stress: No Stress Concern Present (06/27/2024)   [employer] of Occupational Health - Occupational Stress Questionnaire    Feeling of Stress: Only a little  Social Connections: Moderately Isolated (06/27/2024)   Social Connection and Isolation Panel    Frequency of Communication with Friends and Family: Never    Frequency of Social Gatherings with Friends and Family: Once a week    Attends Religious Services: More than 4 times per year    Active Member of [employer] or Organizations: No    Attends Engineer, structural: Not on file    Marital Status: Married    Review of Systems Per HPI  Objective:  BP 116/76   Pulse 69   Ht 5' 10 (1.778 m)   Wt 164 lb (74.4 kg)   SpO2 98%   BMI 23.53 kg/m      07/01/2024    8:33 AM 05/06/2024    7:15 PM 05/06/2024    7:00 PM  BP/Weight  Systolic BP 116 121 120  Diastolic BP 76 71 65  Wt. (Lbs) 164    BMI 23.53 kg/m2      Physical Exam Vitals and nursing note reviewed.  Constitutional:      General: He is not in acute distress.    Appearance: Normal appearance.  HENT:     Head: Normocephalic and atraumatic.  Cardiovascular:     Rate and Rhythm: Normal rate and regular rhythm.  Pulmonary:     Effort: Pulmonary effort is normal.     Breath sounds: Normal breath sounds.  Neurological:     Mental Status: He is alert.     Lab Results  Component Value Date   WBC 4.0 05/06/2024   HGB 15.4 05/06/2024   HCT 43.9 05/06/2024   PLT 148 (L) 05/06/2024   GLUCOSE 112 (H) 05/06/2024   CHOL 149 04/13/2024   TRIG 102 04/13/2024   HDL 45 04/13/2024   LDLCALC 85 04/13/2024   ALT 22 05/06/2024   AST 23 05/06/2024   NA 134 (L)  05/06/2024   K 3.7 05/06/2024   CL 94 (L) 05/06/2024   CREATININE 0.57 (L) 05/06/2024   BUN 23 05/06/2024   CO2 23 05/06/2024   TSH 2.120 04/13/2024   PSA 2.23 10/20/2014   HGBA1C 7.5 (A) 04/24/2024   MICROALBUR 3.6 (H) 10/20/2014     Assessment & Plan:  Vertigo Assessment & Plan: No worrisome features.  Referring for vestibular rehab.  Meclizine  as needed.  Supportive care.  Orders: -     Ambulatory referral to Physical Therapy -     Ambulatory referral to ENT  Influenza vaccine needed -     Flu vaccine trivalent PF, 6mos and older(Flulaval,Afluria,Fluarix,Fluzone)    Follow-up:  Return if symptoms worsen or fail to improve.  Jacqulyn Ahle DO [facility]

## 2024-07-01 NOTE — Patient Instructions (Signed)
 Referral placed for Vestibular rehab (placed as urgent).  Meclizine  as needed.  Take care  Dr. Bluford

## 2024-07-01 NOTE — Assessment & Plan Note (Addendum)
 No worrisome features.  Referring for vestibular rehab.  Meclizine  as needed.  Supportive care.

## 2024-07-07 ENCOUNTER — Encounter (INDEPENDENT_AMBULATORY_CARE_PROVIDER_SITE_OTHER): Payer: Self-pay | Admitting: *Deleted

## 2024-07-07 ENCOUNTER — Encounter: Payer: Self-pay | Admitting: Family Medicine

## 2024-07-07 ENCOUNTER — Other Ambulatory Visit: Payer: Self-pay

## 2024-07-07 MED ORDER — MECLIZINE HCL 25 MG PO TABS: 25.0000 mg | ORAL_TABLET | Freq: Three times a day (TID) | ORAL | 3 refills | Status: AC | PRN

## 2024-07-29 ENCOUNTER — Encounter (INDEPENDENT_AMBULATORY_CARE_PROVIDER_SITE_OTHER): Payer: Self-pay

## 2024-07-29 DIAGNOSIS — R6889 Other general symptoms and signs: Secondary | ICD-10-CM | POA: Diagnosis not present

## 2024-07-31 ENCOUNTER — Ambulatory Visit (HOSPITAL_COMMUNITY): Attending: Family Medicine

## 2024-07-31 ENCOUNTER — Other Ambulatory Visit: Payer: Self-pay

## 2024-07-31 ENCOUNTER — Encounter (HOSPITAL_COMMUNITY): Payer: Self-pay

## 2024-07-31 DIAGNOSIS — R42 Dizziness and giddiness: Secondary | ICD-10-CM | POA: Insufficient documentation

## 2024-07-31 DIAGNOSIS — H5111 Convergence insufficiency: Secondary | ICD-10-CM | POA: Insufficient documentation

## 2024-07-31 DIAGNOSIS — H8113 Benign paroxysmal vertigo, bilateral: Secondary | ICD-10-CM | POA: Insufficient documentation

## 2024-07-31 DIAGNOSIS — H832X9 Labyrinthine dysfunction, unspecified ear: Secondary | ICD-10-CM | POA: Insufficient documentation

## 2024-07-31 NOTE — Therapy (Signed)
 OUTPATIENT PHYSICAL THERAPY VESTIBULAR EVALUATION     Patient Name: Grant Torres MRN: [id number] DOB:05/09/1961, 63 y.o., male Today's Date: 07/31/2024  END OF SESSION:  PT End of Session - 07/31/24 1442     Visit Number 1    Date for Recertification  08/21/24    Authorization Type BCBS COMM PPO    Authorization Time Period seeking auth    Authorization - Visit Number 1    Progress Note Due on Visit 4    PT Start Time 0730    PT Stop Time 0810    PT Time Calculation (min) 40 min    Activity Tolerance Patient tolerated treatment well    Behavior During Therapy [facility] for tasks assessed/performed          Past Medical History:  Diagnosis Date   Diabetes mellitus without complication (HCC)    Hyperlipidemia    Hypertension    Microproteinuria    Past Surgical History:  Procedure Laterality Date   CERVICAL SPINE SURGERY  11/25/2015   COLONOSCOPY N/A 04/10/2018   Procedure: COLONOSCOPY;  Surgeon: Golda Claudis PENNER, MD;  Location: [facility];  Service: Endoscopy;  Laterality: N/A;  200   POLYPECTOMY  04/10/2018   Procedure: POLYPECTOMY;  Surgeon: Golda Claudis PENNER, MD;  Location: [facility];  Service: Endoscopy;;  colon   VASECTOMY     WISDOM TOOTH EXTRACTION     Patient Active Problem List   Diagnosis Date Noted   Vertigo 04/23/2023   Essential hypertension, benign 12/21/2021   Hypothyroidism 08/18/2019   Multinodular goiter 08/11/2018   Mixed hyperlipidemia 09/01/2013   Diabetes (HCC) 09/01/2013    PCP: Cook, Jayce G, DO REFERRING PROVIDER: Cook, Jayce G, DO  REFERRING DIAG: R42 (ICD-10-CM) - Vertigo  THERAPY DIAG:  BPPV (benign paroxysmal positional vertigo), bilateral  Convergence insufficiency  Vestibular hypofunction, unspecified laterality  ONSET DATE: First incident was June/July last year  Rationale for Evaluation and Treatment: Rehabilitation  SUBJECTIVE:   SUBJECTIVE STATEMENT: Pt states he has had a few bad episodes of vertigo. Pt  reports he couldn't focus, couldn't stand up, and stayed in bed all day and the next day was fine. Next occurrence was Christmas time of last year, could not go on road trip, cleared up the next couple of days. Pt states last bad episode was 3 months ago, went to ER because it was so intense. Pt describes a severe headache after the last big episode. Pt states he went to chiropractor and helped after neck adjustments. Pt states the last time he had to take meclizine  was about 3 weeks ago, tends to occur after driving home from work. Pt commutes about an hour each day. Pt describes drunk feeling, getting off a roller coaster, and spinning with dizziness. Index and middle finger on RUE are numb from neck surgery. Going to neurologist the 23rd.  Pt accompanied by: self  PERTINENT HISTORY:  Diabetes 2017 cervical vertebrae replacement  PAIN:  Are you having pain? No  PRECAUTIONS: None  RED FLAGS: None   WEIGHT BEARING RESTRICTIONS: No  FALLS: Has patient fallen in last 6 months? No   PATIENT GOALS: get rid of the dizziness, figure out how to treat himself when it flares up.   OBJECTIVE:  Note: Objective measures were completed at Evaluation unless otherwise noted.  DIAGNOSTIC FINDINGS: CLINICAL DATA:  Provided history: Neuro deficit, acute, stroke suspected. Vertigo.   EXAM: MRI HEAD WITHOUT CONTRAST   TECHNIQUE: Multiplanar, multiecho pulse sequences of the  brain and surrounding structures were obtained without intravenous contrast.   COMPARISON:  None   FINDINGS: Brain:   No age-advanced or lobar predominant cerebral atrophy.   Multifocal T2 FLAIR hyperintense signal abnormality within the cerebral white matter, nonspecific but compatible with mild chronic small vessel ischemic disease.   No cortical encephalomalacia is identified.   There is no acute infarct.   No evidence of an intracranial mass.   No extra-axial fluid collection.   No midline shift.    Vascular: The non-dominant right vertebral artery is developmentally diminutive intracranially flow voids preserved elsewhere within the proximal large arterial vessels.   Skull and upper cervical spine: No focal worrisome marrow lesion.   Sinuses/Orbits: No mass or acute finding within the imaged orbits. Minimal mucosal thickening within the right ethmoid, right sphenoid and bilateral maxillary sinuses.   Other: Atlantooccipital joint effusion on the right.   IMPRESSION: 1.  No evidence of an acute intracranial abnormality. 2. Mild chronic small vessel ischemic changes cerebral white matter. 3. Minor paranasal sinus mucosal thickening.  COGNITION: Overall cognitive status: Within functional limits for tasks assessed   SENSATION: Light touch: Impaired , RUE index and middle finger numb   Cervical ROM:    Active A/PROM (deg) eval  Flexion WFL, worse pain  Extension WFL, stiffness/pain  Right lateral flexion WFL, stiffness/pain  Left lateral flexion WFL, stiffness/pain  Right rotation WFL, stiffness/pain  Left rotation WFL, stiffness/pain  (Blank rows = not tested)  STRENGTH: N/A  LOWER EXTREMITY MMT:   MMT Right eval Left eval  Hip flexion    Hip abduction    Hip adduction    Hip internal rotation    Hip external rotation    Knee flexion    Knee extension    Ankle dorsiflexion    Ankle plantarflexion    Ankle inversion    Ankle eversion    (Blank rows = not tested)   VESTIBULAR ASSESSMENT:    OCULOMOTOR EXAM:  Ocular Alignment: normal  Ocular ROM: No Limitations  Spontaneous Nystagmus: absent  Gaze-Induced Nystagmus: absent  Smooth Pursuits: intact  Saccades: hypometric/undershoots, dizziness on vertical  Convergence/Divergence: 9-10 cm    VESTIBULAR - OCULAR REFLEX:   Slow VOR: Comment: negative horizontal, positive vertical for dizziness and neck crepitus  VOR Cancellation: Comment: normal but positive for dizzy symptoms  Head-Impulse Test:  negative, bilaterally     POSITIONAL TESTING: Right Dix-Hallpike: no nystagmus Left Dix-Hallpike: no nystagmus  MOTION SENSITIVITY:  Motion Sensitivity Quotient Intensity: 0 = none, 1 = Lightheaded, 2 = Mild, 3 = Moderate, 4 = Severe, 5 = Vomiting  Intensity  1. Sitting to supine   2. Supine to L side   3. Supine to R side   4. Supine to sitting   5. L Hallpike-Dix   6. Up from L    7. R Hallpike-Dix   8. Up from R    9. Sitting, head tipped to L knee   10. Head up from L knee   11. Sitting, head tipped to R knee   12. Head up from R knee   13. Sitting head turns x5   14.Sitting head nods x5   15. In stance, 180 turn to L    16. In stance, 180 turn to R     OTHOSTATICS: not done  TREATMENT DATE:  Aug 27, 2024  Evaluation: -ROM measured, Strength assessed, HEP prescribed, pt educated on prognosis, findings, and importance of HEP compliance if given.       Canalith Repositioning:  Comment: none performed today, negative dix hall pike   PATIENT EDUCATION: Education details: Pt was educated on findings of PT evaluation, prognosis, frequency of therapy visits and rationale, attendance policy, and HEP if given.   Person educated: Patient Education method: Explanation, Verbal cues, and Handouts Education comprehension: verbalized understanding, verbal cues required, and needs further education  HOME EXERCISE PROGRAM:  GOALS: Goals reviewed with patient? No  SHORT TERM GOALS: Target date: 08/07/24  Pt will be independent with home exercise program in order to improve balance and decrease dizziness symptoms in order to decrease fall risk and improve function at home and work. Baseline:  Goal status: INITIAL  2.  Pt will report no big episode of dizziness for this week for improved quality of life. Baseline:   Goal status: INITIAL  LONG  TERM GOALS: Target date: 08/21/24  Patient will report 50% or better improvement in their dizziness and imbalance symptoms overall in order for patient to be able to perform ADLs and resume prior activities.  Baseline:  Goal status: INITIAL  2.  Patient will demonstrate ability to complete VOR activities in standing with no LOB or dizziness lasting more than 30 seconds after stopping activity for increased habituation of vestibular system. Baseline: HEP in sitting for VOR and convergence Goal status: INITIAL  3.  Patient will be able to teach back the Epley maneuver for increased knowledge of condition and self management of vertigo should episodes persist. Baseline:  Goal status: INITIAL    ASSESSMENT:  CLINICAL IMPRESSION: Patient is a 62 y.o. male who was seen today for physical therapy evaluation and treatment for R42 (ICD-10-CM) - Vertigo.   Patient demonstrates increased dizziness with vertical saccade, convergence, vertical VORx1, and VOR cancellization testing. Pt demonstrates WFL cervical spine ROM although pt reports stiffness and pain, no dizziness reproduced with cervical ROM. Pt demonstrates negative result on bilateral [employer] and head impulse tests. Patient requires education on role of PT, POC, vestibular system and prognosis. Patient would benefit from skilled physical therapy for decreased dizziness, increased independence with management of dizziness symptoms for improved quality of life, return to higher level of function with ADLs, and progress towards therapy goals.   OBJECTIVE IMPAIRMENTS: decreased activity tolerance, dizziness, and impaired sensation.   ACTIVITY LIMITATIONS: bed mobility  PARTICIPATION LIMITATIONS: driving, occupation, and yard work  PERSONAL FACTORS: Past/current experiences, Time since onset of injury/illness/exacerbation, and 1 comorbidity: diabetes, cervical spine surgery are also affecting patient's functional outcome.   REHAB  POTENTIAL: Good  CLINICAL DECISION MAKING: Stable/uncomplicated  EVALUATION COMPLEXITY: Low   PLAN:  PT FREQUENCY: 1x/week  PT DURATION: 3 weeks  PLANNED INTERVENTIONS: 97110-Therapeutic exercises, 97530- Therapeutic activity, 97112- Neuromuscular re-education, 97535- Self Care, 02859- Manual therapy, Patient/Family education, Balance training, and Vestibular training  PLAN FOR NEXT SESSION: retest Trenda Shona Grebe, teach Epley maneuver, progress VOR, convergence, and saccade training   Lang Ada, PT, DPT [facility] Office: [phone] 3:00 PM, 08-27-24   Managed Medicaid Authorization Request Treatment Start Date: 08-27-24  Visit Dx Codes: H81.13; H51.11; H83.2X9  Functional Tool Score: Convergence greater than 6 cm  For all possible CPT codes, reference the Planned Interventions line above.     Check all conditions that are expected to impact treatment: {Conditions expected to impact treatment:Diabetes mellitus  If treatment provided at initial evaluation, no treatment charged due to lack of authorization.

## 2024-08-05 ENCOUNTER — Ambulatory Visit (HOSPITAL_COMMUNITY)

## 2024-08-05 DIAGNOSIS — H8113 Benign paroxysmal vertigo, bilateral: Secondary | ICD-10-CM

## 2024-08-05 DIAGNOSIS — R42 Dizziness and giddiness: Secondary | ICD-10-CM | POA: Diagnosis not present

## 2024-08-05 DIAGNOSIS — H832X9 Labyrinthine dysfunction, unspecified ear: Secondary | ICD-10-CM | POA: Diagnosis not present

## 2024-08-05 DIAGNOSIS — H5111 Convergence insufficiency: Secondary | ICD-10-CM | POA: Diagnosis not present

## 2024-08-05 NOTE — Therapy (Signed)
 OUTPATIENT PHYSICAL THERAPY VESTIBULAR TREATMENT     Patient Name: Grant Torres MRN: [id number] DOB:[birth year], 62 y.o., male Today's Date: 08/05/2024  END OF SESSION:  PT End of Session - 08/05/24 0727     Visit Number 2    Number of Visits 4    Date for Recertification  08/21/24    Authorization Type BCBS COMM PPO    Authorization Time Period 4 visits approved from 11/01/23 to 08/29/24    Authorization - Visit Number 2    Authorization - Number of Visits 4    Progress Note Due on Visit 4    PT Start Time 0730    PT Stop Time 0810    PT Time Calculation (min) 40 min    Activity Tolerance Patient tolerated treatment well    Behavior During Therapy [facility] for tasks assessed/performed           Past Medical History:  Diagnosis Date   Diabetes mellitus without complication (HCC)    Hyperlipidemia    Hypertension    Microproteinuria    Past Surgical History:  Procedure Laterality Date   CERVICAL SPINE SURGERY  11/25/2015   COLONOSCOPY N/A 04/10/2018   Procedure: COLONOSCOPY;  Surgeon: Golda Claudis PENNER, MD;  [facility];  Service: Endoscopy;  Laterality: N/A;  200   POLYPECTOMY  04/10/2018   Procedure: POLYPECTOMY;  Surgeon: Golda Claudis PENNER, MD;  [facility];  Service: Endoscopy;;  colon   VASECTOMY     WISDOM TOOTH EXTRACTION     Patient Active Problem List   Diagnosis Date Noted   Vertigo 04/23/2023   Essential hypertension, benign 12/21/2021   Hypothyroidism 08/18/2019   Multinodular goiter 08/11/2018   Mixed hyperlipidemia 09/01/2013   Diabetes (HCC) 09/01/2013    PCP: Bluford Jacqulyn MATSU, DO REFERRING PROVIDER: Cook, Jayce G, DO  REFERRING DIAG: R42 (ICD-10-CM) - Vertigo  THERAPY DIAG:  BPPV (benign paroxysmal positional vertigo), bilateral  Convergence insufficiency  Vestibular hypofunction, unspecified laterality  ONSET DATE: First incident was June/July last year  Rationale for Evaluation and Treatment:  Rehabilitation  SUBJECTIVE:   SUBJECTIVE STATEMENT: No dizziness since last visit; some dizziness with gaze stabilization exercises more with vertical that with rotation.  Has been compliant with HEP   EVAL:  states he has had a few bad episodes of vertigo. Pt reports he couldn't focus, couldn't stand up, and stayed in bed all day and the next day was fine. Next occurrence was Christmas time of last year, could not go on road trip, cleared up the next couple of days. Pt states last bad episode was 3 months ago, went to ER because it was so intense. Pt describes a severe headache after the last big episode. Pt states he went to chiropractor and helped after neck adjustments. Pt states the last time he had to take meclizine  was about 3 weeks ago, tends to occur after driving home from work. Pt commutes about an hour each day. Pt describes drunk feeling, getting off a roller coaster, and spinning with dizziness. Index and middle finger on RUE are numb from neck surgery. Going to neurologist the 23rd.  Pt accompanied by: self  PERTINENT HISTORY:  Diabetes 2017 cervical vertebrae replacement  PAIN:  Are you having pain? No  PRECAUTIONS: None  RED FLAGS: None   WEIGHT BEARING RESTRICTIONS: No  FALLS: Has patient fallen in last 6 months? No   PATIENT GOALS: get rid of the dizziness, figure out how to treat himself  when it flares up.   OBJECTIVE:  Note: Objective measures were completed at Evaluation unless otherwise noted.  DIAGNOSTIC FINDINGS: CLINICAL DATA:  Provided history: Neuro deficit, acute, stroke suspected. Vertigo.   EXAM: MRI HEAD WITHOUT CONTRAST   TECHNIQUE: Multiplanar, multiecho pulse sequences of the brain and surrounding structures were obtained without intravenous contrast.   COMPARISON:  None   FINDINGS: Brain:   No age-advanced or lobar predominant cerebral atrophy.   Multifocal T2 FLAIR hyperintense signal abnormality within the cerebral white  matter, nonspecific but compatible with mild chronic small vessel ischemic disease.   No cortical encephalomalacia is identified.   There is no acute infarct.   No evidence of an intracranial mass.   No extra-axial fluid collection.   No midline shift.   Vascular: The non-dominant right vertebral artery is developmentally diminutive intracranially flow voids preserved elsewhere within the proximal large arterial vessels.   Skull and upper cervical spine: No focal worrisome marrow lesion.   Sinuses/Orbits: No mass or acute finding within the imaged orbits. Minimal mucosal thickening within the right ethmoid, right sphenoid and bilateral maxillary sinuses.   Other: Atlantooccipital joint effusion on the right.   IMPRESSION: 1.  No evidence of an acute intracranial abnormality. 2. Mild chronic small vessel ischemic changes cerebral white matter. 3. Minor paranasal sinus mucosal thickening.  COGNITION: Overall cognitive status: Within functional limits for tasks assessed   SENSATION: Light touch: Impaired , RUE index and middle finger numb   Cervical ROM:    Active A/PROM (deg) eval  Flexion WFL, worse pain  Extension WFL, stiffness/pain  Right lateral flexion WFL, stiffness/pain  Left lateral flexion WFL, stiffness/pain  Right rotation WFL, stiffness/pain  Left rotation WFL, stiffness/pain  (Blank rows = not tested)  STRENGTH: N/A  LOWER EXTREMITY MMT:   MMT Right eval Left eval  Hip flexion    Hip abduction    Hip adduction    Hip internal rotation    Hip external rotation    Knee flexion    Knee extension    Ankle dorsiflexion    Ankle plantarflexion    Ankle inversion    Ankle eversion    (Blank rows = not tested)   VESTIBULAR ASSESSMENT:    OCULOMOTOR EXAM:  Ocular Alignment: normal  Ocular ROM: No Limitations  Spontaneous Nystagmus: absent  Gaze-Induced Nystagmus: absent  Smooth Pursuits: intact  Saccades: hypometric/undershoots,  dizziness on vertical  Convergence/Divergence: 9-10 cm    VESTIBULAR - OCULAR REFLEX:   Slow VOR: Comment: negative horizontal, positive vertical for dizziness and neck crepitus  VOR Cancellation: Comment: normal but positive for dizzy symptoms  Head-Impulse Test: negative, bilaterally     POSITIONAL TESTING: Right Dix-Hallpike: no nystagmus Left Dix-Hallpike: no nystagmus  MOTION SENSITIVITY:  Motion Sensitivity Quotient Intensity: 0 = none, 1 = Lightheaded, 2 = Mild, 3 = Moderate, 4 = Severe, 5 = Vomiting  Intensity  1. Sitting to supine   2. Supine to L side   3. Supine to R side   4. Supine to sitting   5. L Hallpike-Dix   6. Up from L    7. R Hallpike-Dix   8. Up from R    9. Sitting, head tipped to L knee   10. Head up from L knee   11. Sitting, head tipped to R knee   12. Head up from R knee   13. Sitting head turns x5   14.Sitting head nods x5   15. In stance, 180 turn to  L    16. In stance, 180 turn to R     OTHOSTATICS: not done                                                                                                                              TREATMENT DATE:  08/05/24 Review of HEP and goals Gaze stabilization x 10 up and down and rotation Pencil pushups x 10 Testing for posterior canal BPPV negative bilaterally Education on self-epley     07/31/2024  Evaluation: -ROM measured, Strength assessed, HEP prescribed, pt educated on prognosis, findings, and importance of HEP compliance if given.       Canalith Repositioning:  Comment: none performed today, negative dix hall pike   PATIENT EDUCATION: Education details: Pt was educated on findings of PT evaluation, prognosis, frequency of therapy visits and rationale, attendance policy, and HEP if given.   Person educated: Patient Education method: Explanation, Verbal cues, and Handouts Education comprehension: verbalized understanding, verbal cues required, and needs further  education  HOME EXERCISE PROGRAM: Access Code: [email or web address] URL: [email or web address] Date: 08/05/2024 Prepared by: [facility] - Rehab  Exercises - Seated Gaze Stabilization with Head Rotation  - 1 x daily - 7 x weekly - 3 sets - 10 reps - Seated Gaze Stabilization with Head Nod  - 1 x daily - 7 x weekly - 3 sets - 10 reps - Pencil Pushups  - 1 x daily - 7 x weekly - 3 sets - 10 reps - Self-Epley Maneuver Right Ear  - 1 x daily - 7 x weekly - 1 sets - 2 reps - Self-Epley Maneuver Left Ear  - 1 x daily - 7 x weekly - 1 sets - 2 reps GOALS: Goals reviewed with patient? No  SHORT TERM GOALS: Target date: 08/07/24  Pt will be independent with home exercise program in order to improve balance and decrease dizziness symptoms in order to decrease fall risk and improve function at home and work. Baseline:  Goal status: IN PROGRESS  2.  Pt will report no big episode of dizziness for this week for improved quality of life. Baseline:   Goal status: IN PROGRESS  LONG TERM GOALS: Target date: 08/21/24  Patient will report 50% or better improvement in their dizziness and imbalance symptoms overall in order for patient to be able to perform ADLs and resume prior activities.  Baseline:  Goal status: IN PROGRESS  2.  Patient will demonstrate ability to complete VOR activities in standing with no LOB or dizziness lasting more than 30 seconds after stopping activity for increased habituation of vestibular system. Baseline: HEP in sitting for VOR and convergence Goal status: IN PROGRESS  3.  Patient will be able to teach back the Epley maneuver for increased knowledge of condition and self management of vertigo should episodes persist. Baseline:  Goal status: IN PROGRESS    ASSESSMENT:  CLINICAL IMPRESSION Today's session started with review of HEP and goals. Patient  verbalizes agreement with set rehab goals.  Negative testing for posterior canal BPPV today.  Discussion of Epley for  home and to only perform if he has another episode of dizziness.  Discussed speeding up his gaze stabilization exercises to progress them as he only has mild dizziness today lasting a couple sec after finishing exercises.  Patient will benefit from continued skilled therapy services to address deficits and promote return to optimal function.       Patient is a 63 y.o. male who was seen today for physical therapy evaluation and treatment for R42 (ICD-10-CM) - Vertigo.   Patient demonstrates increased dizziness with vertical saccade, convergence, vertical VORx1, and VOR cancellization testing. Pt demonstrates WFL cervical spine ROM although pt reports stiffness and pain, no dizziness reproduced with cervical ROM. Pt demonstrates negative result on bilateral Intel and head impulse tests. Patient requires education on role of PT, POC, vestibular system and prognosis. Patient would benefit from skilled physical therapy for decreased dizziness, increased independence with management of dizziness symptoms for improved quality of life, return to higher level of function with ADLs, and progress towards therapy goals.   OBJECTIVE IMPAIRMENTS: decreased activity tolerance, dizziness, and impaired sensation.   ACTIVITY LIMITATIONS: bed mobility  PARTICIPATION LIMITATIONS: driving, occupation, and yard work  PERSONAL FACTORS: Past/current experiences, Time since onset of injury/illness/exacerbation, and 1 comorbidity: diabetes, cervical spine surgery are also affecting patient's functional outcome.   REHAB POTENTIAL: Good  CLINICAL DECISION MAKING: Stable/uncomplicated  EVALUATION COMPLEXITY: Low   PLAN:  PT FREQUENCY: 1x/week  PT DURATION: 3 weeks  PLANNED INTERVENTIONS: 97110-Therapeutic exercises, 97530- Therapeutic activity, 97112- Neuromuscular re-education, 97535- Self Care, [id number]- Manual therapy, Patient/Family education, Balance training, and Vestibular training  PLAN FOR NEXT  SESSION: progress VOR, convergence, and saccade training; consider d/c if no dizziness reproduced   8:07 AM, 08/05/24 Geoffrey Mankin Small Yarielys Beed MPT [facility] [place] [phone] Ph:[phone]

## 2024-08-18 ENCOUNTER — Ambulatory Visit (HOSPITAL_COMMUNITY)

## 2024-08-18 DIAGNOSIS — H832X9 Labyrinthine dysfunction, unspecified ear: Secondary | ICD-10-CM

## 2024-08-18 DIAGNOSIS — H5111 Convergence insufficiency: Secondary | ICD-10-CM

## 2024-08-18 DIAGNOSIS — R42 Dizziness and giddiness: Secondary | ICD-10-CM | POA: Diagnosis not present

## 2024-08-18 DIAGNOSIS — H8113 Benign paroxysmal vertigo, bilateral: Secondary | ICD-10-CM

## 2024-08-18 NOTE — Therapy (Signed)
 OUTPATIENT PHYSICAL THERAPY VESTIBULAR TREATMENT/DISCHARGE PHYSICAL THERAPY DISCHARGE SUMMARY  Visits from Start of Care: 3  Current functional level related to goals / functional outcomes: See below   Remaining deficits: See below   Education / Equipment: HEP   Patient agrees to discharge. Patient goals were met. Patient is being discharged due to meeting the stated rehab goals.      Patient Name: Grant Torres MRN: [id number] DOB:25-Feb-1961, 63 y.o., male Today's Date: 08/18/2024  END OF SESSION:  PT End of Session - 08/18/24 1419     Visit Number 3    Number of Visits 4    Date for Recertification  08/21/24    Authorization Type BCBS COMM PPO    Authorization Time Period 4 visits approved from 11/01/23 to 08/29/24    Authorization - Visit Number 3    Authorization - Number of Visits 4    Progress Note Due on Visit 4    PT Start Time 1419    PT Stop Time 1500    PT Time Calculation (min) 41 min    Activity Tolerance Patient tolerated treatment well    Behavior During Therapy [facility] for tasks assessed/performed           Past Medical History:  Diagnosis Date   Diabetes mellitus without complication (HCC)    Hyperlipidemia    Hypertension    Microproteinuria    Past Surgical History:  Procedure Laterality Date   CERVICAL SPINE SURGERY  11/25/2015   COLONOSCOPY N/A 04/10/2018   Procedure: COLONOSCOPY;  Surgeon: Golda Claudis PENNER, MD;  [facility];  Service: Endoscopy;  Laterality: N/A;  200   POLYPECTOMY  04/10/2018   Procedure: POLYPECTOMY;  Surgeon: Golda Claudis PENNER, MD;  [facility];  Service: Endoscopy;;  colon   VASECTOMY     WISDOM TOOTH EXTRACTION     Patient Active Problem List   Diagnosis Date Noted   Vertigo 04/23/2023   Essential hypertension, benign 12/21/2021   Hypothyroidism 08/18/2019   Multinodular goiter 08/11/2018   Mixed hyperlipidemia 09/01/2013   Diabetes (HCC) 09/01/2013    PCP: Bluford Jacqulyn MATSU, DO REFERRING  PROVIDER: Cook, Jayce G, DO  REFERRING DIAG: R42 (ICD-10-CM) - Vertigo  THERAPY DIAG:  BPPV (benign paroxysmal positional vertigo), bilateral  Convergence insufficiency  Vestibular hypofunction, unspecified laterality  ONSET DATE: First incident was June/July last year  Rationale for Evaluation and Treatment: Rehabilitation  SUBJECTIVE:   SUBJECTIVE STATEMENT: No dizziness since last visit; sees neurologist on Thursday; has been compliant with HEP   EVAL:  states he has had a few bad episodes of vertigo. Pt reports he couldn't focus, couldn't stand up, and stayed in bed all day and the next day was fine. Next occurrence was Christmas time of last year, could not go on road trip, cleared up the next couple of days. Pt states last bad episode was 3 months ago, went to ER because it was so intense. Pt describes a severe headache after the last big episode. Pt states he went to chiropractor and helped after neck adjustments. Pt states the last time he had to take meclizine  was about 3 weeks ago, tends to occur after driving home from work. Pt commutes about an hour each day. Pt describes drunk feeling, getting off a roller coaster, and spinning with dizziness. Index and middle finger on RUE are numb from neck surgery. Going to neurologist the 23rd.  Pt accompanied by: self  PERTINENT HISTORY:  Diabetes 2017 cervical  vertebrae replacement  PAIN:  Are you having pain? No  PRECAUTIONS: None  RED FLAGS: None   WEIGHT BEARING RESTRICTIONS: No  FALLS: Has patient fallen in last 6 months? No   PATIENT GOALS: get rid of the dizziness, figure out how to treat himself when it flares up.   OBJECTIVE:  Note: Objective measures were completed at Evaluation unless otherwise noted.  DIAGNOSTIC FINDINGS: CLINICAL DATA:  Provided history: Neuro deficit, acute, stroke suspected. Vertigo.   EXAM: MRI HEAD WITHOUT CONTRAST   TECHNIQUE: Multiplanar, multiecho pulse sequences of the  brain and surrounding structures were obtained without intravenous contrast.   COMPARISON:  None   FINDINGS: Brain:   No age-advanced or lobar predominant cerebral atrophy.   Multifocal T2 FLAIR hyperintense signal abnormality within the cerebral white matter, nonspecific but compatible with mild chronic small vessel ischemic disease.   No cortical encephalomalacia is identified.   There is no acute infarct.   No evidence of an intracranial mass.   No extra-axial fluid collection.   No midline shift.   Vascular: The non-dominant right vertebral artery is developmentally diminutive intracranially flow voids preserved elsewhere within the proximal large arterial vessels.   Skull and upper cervical spine: No focal worrisome marrow lesion.   Sinuses/Orbits: No mass or acute finding within the imaged orbits. Minimal mucosal thickening within the right ethmoid, right sphenoid and bilateral maxillary sinuses.   Other: Atlantooccipital joint effusion on the right.   IMPRESSION: 1.  No evidence of an acute intracranial abnormality. 2. Mild chronic small vessel ischemic changes cerebral white matter. 3. Minor paranasal sinus mucosal thickening.  COGNITION: Overall cognitive status: Within functional limits for tasks assessed   SENSATION: Light touch: Impaired , RUE index and middle finger numb   Cervical ROM:    Active A/PROM (deg) eval  Flexion WFL, worse pain  Extension WFL, stiffness/pain  Right lateral flexion WFL, stiffness/pain  Left lateral flexion WFL, stiffness/pain  Right rotation WFL, stiffness/pain  Left rotation WFL, stiffness/pain  (Blank rows = not tested)  STRENGTH: N/A  LOWER EXTREMITY MMT:   MMT Right eval Left eval  Hip flexion    Hip abduction    Hip adduction    Hip internal rotation    Hip external rotation    Knee flexion    Knee extension    Ankle dorsiflexion    Ankle plantarflexion    Ankle inversion    Ankle eversion     (Blank rows = not tested)   VESTIBULAR ASSESSMENT:    OCULOMOTOR EXAM:  Ocular Alignment: normal  Ocular ROM: No Limitations  Spontaneous Nystagmus: absent  Gaze-Induced Nystagmus: absent  Smooth Pursuits: intact  Saccades: hypometric/undershoots, dizziness on vertical  Convergence/Divergence: 9-10 cm    VESTIBULAR - OCULAR REFLEX:   Slow VOR: Comment: negative horizontal, positive vertical for dizziness and neck crepitus  VOR Cancellation: Comment: normal but positive for dizzy symptoms  Head-Impulse Test: negative, bilaterally     POSITIONAL TESTING: Right Dix-Hallpike: no nystagmus Left Dix-Hallpike: no nystagmus  MOTION SENSITIVITY:  Motion Sensitivity Quotient Intensity: 0 = none, 1 = Lightheaded, 2 = Mild, 3 = Moderate, 4 = Severe, 5 = Vomiting  Intensity  1. Sitting to supine   2. Supine to L side   3. Supine to R side   4. Supine to sitting   5. L Hallpike-Dix   6. Up from L    7. R Hallpike-Dix   8. Up from R    9. Sitting, head tipped to  L knee   10. Head up from L knee   11. Sitting, head tipped to R knee   12. Head up from R knee   13. Sitting head turns x5   14.Sitting head nods x5   15. In stance, 180 turn to L    16. In stance, 180 turn to R     OTHOSTATICS: not done                                                                                                                              TREATMENT DATE:  08/18/24 Goals re-check Cervical rotation with towel 10 hold x 3 Scapular retractions 5 x 5 Sitting with towel roll/lumbar roll     08/05/24 Review of HEP and goals Gaze stabilization x 10 up and down and rotation Pencil pushups x 10 Testing for posterior canal BPPV negative bilaterally Education on self-epley     07/31/2024  Evaluation: -ROM measured, Strength assessed, HEP prescribed, pt educated on prognosis, findings, and importance of HEP compliance if given.       Canalith Repositioning:  Comment: none performed  today, negative dix hall pike   PATIENT EDUCATION: Education details: Pt was educated on findings of PT evaluation, prognosis, frequency of therapy visits and rationale, attendance policy, and HEP if given.   Person educated: Patient Education method: Explanation, Verbal cues, and Handouts Education comprehension: verbalized understanding, verbal cues required, and needs further education  HOME EXERCISE PROGRAM:  Access Code: [email or web address] URL: [email or web address] Date: 08/18/2024 Prepared by: [facility] - Rehab  Exercises - Seated Assisted Cervical Rotation with Towel  - 1 x daily - 7 x weekly - 1 sets - 5 reps - 10 sec hold - seated posture with towel roll  - 1 x daily - 7 x weekly - 1 sets - 1 reps - Seated Scapular Retraction  - 1 x daily - 7 x weekly - 1 sets - 10 reps - 5 sec hold  Access Code: [email or web address] URL: [email or web address] Date: 08/05/2024 Prepared by: [facility] - Rehab  Exercises - Seated Gaze Stabilization with Head Rotation  - 1 x daily - 7 x weekly - 3 sets - 10 reps - Seated Gaze Stabilization with Head Nod  - 1 x daily - 7 x weekly - 3 sets - 10 reps - Pencil Pushups  - 1 x daily - 7 x weekly - 3 sets - 10 reps - Self-Epley Maneuver Right Ear  - 1 x daily - 7 x weekly - 1 sets - 2 reps - Self-Epley Maneuver Left Ear  - 1 x daily - 7 x weekly - 1 sets - 2 reps GOALS: Goals reviewed with patient? No  SHORT TERM GOALS: Target date: 08/07/24  Pt will be independent with home exercise program in order to improve balance and decrease dizziness symptoms in order to decrease fall risk and improve function at home and work. Baseline:  Goal status: MET  2.  Pt  will report no big episode of dizziness for this week for improved quality of life. Baseline:   Goal status: MET  LONG TERM GOALS: Target date: 08/21/24  Patient will report 50% or better improvement in their dizziness and imbalance symptoms overall in order for patient to be able to perform  ADLs and resume prior activities.  Baseline:  Goal status: MET  2.  Patient will demonstrate ability to complete VOR activities in standing with no LOB or dizziness lasting more than 30 seconds after stopping activity for increased habituation of vestibular system. Baseline: HEP in sitting for VOR and convergence Goal status: MET  3.  Patient will be able to teach back the Epley maneuver for increased knowledge of condition and self management of vertigo should episodes persist. Baseline:  Goal status: MET    ASSESSMENT:  CLINICAL IMPRESSION Progress note today; has met all set rehab goals. Agreeable to discharge.  Does reports some neck stiffness so added some cervical mobility exercise to HEP and instructed him on what to do if dizziness returns.  Patient is a 63 y.o. male who was seen today for physical therapy evaluation and treatment for R42 (ICD-10-CM) - Vertigo.   Patient demonstrates increased dizziness with vertical saccade, convergence, vertical VORx1, and VOR cancellization testing. Pt demonstrates WFL cervical spine ROM although pt reports stiffness and pain, no dizziness reproduced with cervical ROM. Pt demonstrates negative result on bilateral Intel and head impulse tests. Patient requires education on role of PT, POC, vestibular system and prognosis. Patient would benefit from skilled physical therapy for decreased dizziness, increased independence with management of dizziness symptoms for improved quality of life, return to higher level of function with ADLs, and progress towards therapy goals.   OBJECTIVE IMPAIRMENTS: decreased activity tolerance, dizziness, and impaired sensation.   ACTIVITY LIMITATIONS: bed mobility  PARTICIPATION LIMITATIONS: driving, occupation, and yard work  PERSONAL FACTORS: Past/current experiences, Time since onset of injury/illness/exacerbation, and 1 comorbidity: diabetes, cervical spine surgery are also affecting patient's functional  outcome.   REHAB POTENTIAL: Good  CLINICAL DECISION MAKING: Stable/uncomplicated  EVALUATION COMPLEXITY: Low   PLAN:  PT FREQUENCY: 1x/week  PT DURATION: 3 weeks  PLANNED INTERVENTIONS: 97110-Therapeutic exercises, 97530- Therapeutic activity, [id number]- Neuromuscular re-education, 97535- Self Care, 02859- Manual therapy, Patient/Family education, Balance training, and Vestibular training  PLAN FOR NEXT SESSION: discharge 2:48 PM, 08/18/24 Quanita Barona Small Rajean Desantiago MPT [facility] [place] [phone] Ph:[phone]

## 2024-08-19 ENCOUNTER — Encounter (HOSPITAL_COMMUNITY): Admitting: Physical Therapy

## 2024-08-20 ENCOUNTER — Ambulatory Visit: Admitting: Neurology

## 2024-08-20 ENCOUNTER — Encounter (INDEPENDENT_AMBULATORY_CARE_PROVIDER_SITE_OTHER): Payer: Self-pay

## 2024-08-20 ENCOUNTER — Other Ambulatory Visit: Payer: Self-pay | Admitting: "Endocrinology

## 2024-08-25 ENCOUNTER — Encounter (HOSPITAL_COMMUNITY)

## 2024-09-02 ENCOUNTER — Encounter: Payer: Self-pay | Admitting: "Endocrinology

## 2024-09-02 ENCOUNTER — Ambulatory Visit: Admitting: "Endocrinology

## 2024-09-02 VITALS — BP 100/60 | HR 71 | Resp 18 | Ht 69.0 in | Wt 167.8 lb

## 2024-09-02 DIAGNOSIS — I10 Essential (primary) hypertension: Secondary | ICD-10-CM

## 2024-09-02 DIAGNOSIS — E038 Other specified hypothyroidism: Secondary | ICD-10-CM

## 2024-09-02 DIAGNOSIS — E782 Mixed hyperlipidemia: Secondary | ICD-10-CM

## 2024-09-02 DIAGNOSIS — E119 Type 2 diabetes mellitus without complications: Secondary | ICD-10-CM

## 2024-09-02 DIAGNOSIS — Z7984 Long term (current) use of oral hypoglycemic drugs: Secondary | ICD-10-CM

## 2024-09-02 MED ORDER — GLIPIZIDE ER 5 MG PO TB24: 5.0000 mg | ORAL_TABLET | Freq: Every day | ORAL | 1 refills | Status: AC

## 2024-09-02 NOTE — Progress Notes (Signed)
 09/02/2024, 9:02 AM  Endocrinology follow-up note   Subjective:    Patient ID: Grant Torres, male    DOB: Oct 11, 1961.  Grant Torres is being seen in follow-up after he was seen in consultation for management of currently uncontrolled asymptomatic diabetes requested by  Cook, Jayce G, DO.   Past Medical History:  Diagnosis Date   Diabetes mellitus without complication (HCC)    Hyperlipidemia    Hypertension    Microproteinuria     Past Surgical History:  Procedure Laterality Date   CERVICAL SPINE SURGERY  11/25/2015   COLONOSCOPY N/A 04/10/2018   Procedure: COLONOSCOPY;  Surgeon: Golda Claudis PENNER, MD;  Location: [facility];  Service: Endoscopy;  Laterality: N/A;  200   POLYPECTOMY  04/10/2018   Procedure: POLYPECTOMY;  Surgeon: Golda Claudis PENNER, MD;  Location: [facility];  Service: Endoscopy;;  colon   VASECTOMY     WISDOM TOOTH EXTRACTION      Social History   Socioeconomic History   Marital status: Married    Spouse name: Not on file   Number of children: Not on file   Years of education: Not on file   Highest education level: Some college, no degree  Occupational History   Not on file  Tobacco Use   Smoking status: Never   Smokeless tobacco: Never  Vaping Use   Vaping status: Never Used  Substance and Sexual Activity   Alcohol use: Yes    Comment: occasionally   Drug use: Never   Sexual activity: Not on file  Other Topics Concern   Not on file  Social History Narrative   Not on file   Social Drivers of Health   Financial Resource Strain: Medium Risk (06/27/2024)   Overall Financial Resource Strain (CARDIA)    Difficulty of Paying Living Expenses: Somewhat hard  Food Insecurity: No Food Insecurity (06/27/2024)   Hunger Vital Sign    Worried About Running Out of Food in the Last Year: Never true    Ran Out of Food in the Last Year: Never true  Transportation  Needs: No Transportation Needs (06/27/2024)   PRAPARE - Administrator, Civil Service (Medical): No    Lack of Transportation (Non-Medical): No  Physical Activity: Insufficiently Active (06/27/2024)   Exercise Vital Sign    Days of Exercise per Week: 2 days    Minutes of Exercise per Session: 20 min  Stress: No Stress Concern Present (06/27/2024)   [employer] of Occupational Health - Occupational Stress Questionnaire    Feeling of Stress: Only a little  Social Connections: Moderately Isolated (06/27/2024)   Social Connection and Isolation Panel    Frequency of Communication with Friends and Family: Never    Frequency of Social Gatherings with Friends and Family: Once a week    Attends Religious Services: More than 4 times per year    Active Member of [employer] or Organizations: No    Attends Engineer, Structural: Not on file    Marital Status: Married    Family History  Problem Relation Age of Onset  Kidney disease Mother    Diabetes Mother    Heart attack Mother    Stroke Mother    Heart failure Mother    Heart attack Father    Hypertension Sister    Colon cancer Neg Hx    Thyroid  disease Neg Hx     Outpatient Encounter Medications as of 09/02/2024  Medication Sig   ACCU-CHEK GUIDE test strip USE 1 TWICE DAILY TO CHECK BLOOD GLUCOSE   Accu-Chek Softclix Lancets lancets USE 1 LANCET TO CHECK GLUCOSE TWICE DAILY   aspirin EC 81 MG tablet Take 81 mg by mouth daily.   atorvastatin  (LIPITOR) 10 MG tablet Take 1 tablet (10 mg total) by mouth at bedtime.   blood glucose meter kit and supplies KIT Dispense based on patient and insurance preference. Use to test blood sugar twice daily. ICD 10 code E11.9   empagliflozin  (JARDIANCE ) 25 MG TABS tablet Take 1 tablet (25 mg total) by mouth daily before breakfast.   enalapril  (VASOTEC ) 10 MG tablet Take 1 tablet by mouth once daily (Patient taking differently: Take 0.5 tablets by mouth daily. Take 1 tablet by mouth once  daily)   levothyroxine  (SYNTHROID ) 50 MCG tablet Take 1 tablet (50 mcg total) by mouth daily before breakfast.   meclizine  (ANTIVERT ) 25 MG tablet Take 1 tablet (25 mg total) by mouth 3 (three) times daily as needed for dizziness.   metFORMIN  (GLUCOPHAGE -XR) 500 MG 24 hr tablet TAKE 1 TABLET BY MOUTH TWICE DAILY WITH A MEAL   sildenafil  (VIAGRA ) 25 MG tablet Take 1 tablet (25 mg total) by mouth daily as needed for erectile dysfunction.   [DISCONTINUED] glipiZIDE  (GLUCOTROL  XL) 2.5 MG 24 hr tablet Take 1 tablet (2.5 mg total) by mouth daily with breakfast.   glipiZIDE  (GLUCOTROL  XL) 5 MG 24 hr tablet Take 1 tablet (5 mg total) by mouth daily with breakfast.   No facility-administered encounter medications on file as of 09/02/2024.    ALLERGIES: No Known Allergies  VACCINATION STATUS: Immunization History  Administered Date(s) Administered   Influenza, Quadrivalent, Recombinant, Inj, Pf 08/04/2019   Influenza, Seasonal, Injecte, Preservative Fre 07/01/2024   Influenza,inj,Quad PF,6+ Mos 10/26/2015, 08/15/2018   Influenza-Unspecified 08/05/2013, 10/26/2015, 07/26/2016, 08/15/2018, 07/22/2020, 10/10/2021, 11/24/2022   PFIZER(Purple Top)SARS-COV-2 Vaccination 01/03/2020, 01/24/2020   Pneumococcal Conjugate,unspecified 11/13/2002   Pneumococcal-Unspecified 11/13/2002   Tdap 11/26/2018    Diabetes He presents for his follow-up diabetic visit. He has type 2 diabetes mellitus. Onset time: He was diagnosed at approximate age of 36 years. His disease course has been worsening. There are no hypoglycemic associated symptoms. Pertinent negatives for hypoglycemia include no confusion, headaches, pallor or seizures. There are no diabetic associated symptoms. Pertinent negatives for diabetes include no chest pain, no fatigue, no polydipsia, no polyphagia, no polyuria and no weakness. There are no hypoglycemic complications. Symptoms are worsening. Diabetic complications include impotence. Risk factors for  coronary artery disease include diabetes mellitus, dyslipidemia, hypertension, male sex and family history. Current diabetic treatments: He is currently on metformin  500 mg p.o. twice daily, Jardiance  25 mg p.o. once a day. His weight is fluctuating minimally (Patient reports that historically he weighed as high as 211 pounds, currently 161 pounds.). He has not had a previous visit with a dietitian. He participates in exercise intermittently. (He presents with slight worsening of glycemic profile with point-of-care A1c of 7.8%.  He did not bring any meter nor logs with him.  He did have more or less stressful time between visits.  He continues to  tolerate Jardiance  and metformin , and low-dose glipizide .    ) An ACE inhibitor/angiotensin II receptor blocker is being taken.  Hyperlipidemia This is a chronic problem. The current episode started more than 1 year ago. The problem is controlled. Exacerbating diseases include diabetes. Pertinent negatives include no chest pain, myalgias or shortness of breath. Risk factors for coronary artery disease include diabetes mellitus, dyslipidemia, family history, hypertension and male sex.  Hypertension This is a chronic problem. The current episode started more than 1 year ago. Pertinent negatives include no chest pain, headaches, neck pain, palpitations or shortness of breath. Risk factors for coronary artery disease include dyslipidemia, diabetes mellitus and family history. Past treatments include ACE inhibitors. Identifiable causes of hypertension include a thyroid  problem.  Thyroid  Problem Presents for initial visit. Onset time: Patient is known to have multinodular goiter between 2017-2019 and thyroid  sonograms.  He also has multiple nodules in his thyroid .  1 nodule was biopsied with benign outcomes. Patient reports no constipation, diarrhea, fatigue or palpitations. Past treatments include levothyroxine . Prior procedures include thyroid  FNA. The following  procedures have not been performed: radioiodine uptake scan and thyroidectomy. His past medical history is significant for diabetes and hyperlipidemia.     Objective:       09/02/2024    8:25 AM 07/01/2024    8:33 AM 05/06/2024    7:15 PM  Vitals with BMI  Height 5' 9 5' 10   Weight 167 lbs 13 oz 164 lbs   BMI 24.77 23.53   Systolic 100 116 878  Diastolic 60 76 71  Pulse 71 69 74    BP 100/60   Pulse 71   Resp 18   Ht 5' 9 (1.753 m)   Wt 167 lb 12.8 oz (76.1 kg)   SpO2 99%   BMI 24.78 kg/m   Wt Readings from Last 3 Encounters:  09/02/24 167 lb 12.8 oz (76.1 kg)  07/01/24 164 lb (74.4 kg)  05/06/24 161 lb (73 kg)      CMP ( most recent) CMP     Component Value Date/Time   NA 134 (L) 05/06/2024 1512   NA 141 04/13/2024 0912   K 3.7 05/06/2024 1512   CL 94 (L) 05/06/2024 1512   CO2 23 05/06/2024 1512   GLUCOSE 112 (H) 05/06/2024 1512   BUN 23 05/06/2024 1512   BUN 10 04/13/2024 0912   CREATININE 0.57 (L) 05/06/2024 1512   CREATININE 0.77 10/20/2014 0937   CALCIUM  9.4 05/06/2024 1512   PROT 7.4 05/06/2024 1512   PROT 6.6 04/13/2024 0912   ALBUMIN 4.0 05/06/2024 1512   ALBUMIN 4.4 04/13/2024 0912   AST 23 05/06/2024 1512   ALT 22 05/06/2024 1512   ALKPHOS 53 05/06/2024 1512   BILITOT 1.7 (H) 05/06/2024 1512   BILITOT 0.6 04/13/2024 0912   GFRNONAA >60 05/06/2024 1512   GFRAA 120 08/01/2020 0904     Diabetic Labs (most recent): Lab Results  Component Value Date   HGBA1C 7.5 (A) 04/24/2024   HGBA1C 7.8 (A) 12/26/2023   HGBA1C 7.5 (A) 08/22/2023   MICROALBUR 3.6 (H) 10/20/2014   MICROALBUR 1.99 (H) 09/01/2013     Lipid Panel ( most recent) Lipid Panel     Component Value Date/Time   CHOL 149 04/13/2024 0912   TRIG 102 04/13/2024 0912   HDL 45 04/13/2024 0912   CHOLHDL 3.3 04/13/2024 0912   CHOLHDL 2.9 10/20/2014 0937   VLDL 20 10/20/2014 0937   LDLCALC 85 04/13/2024 0912  LABVLDL 19 04/13/2024 0912      Lab Results  Component Value Date    TSH 2.120 04/13/2024   TSH 2.990 08/14/2023   TSH 4.130 05/30/2022   TSH 2.22 08/16/2021   TSH 2.66 08/16/2020   TSH 2.710 09/18/2019   TSH 2.76 08/11/2018   TSH 3.880 01/13/2016   FREET4 1.51 04/13/2024   FREET4 1.66 08/14/2023   FREET4 1.39 05/30/2022   FREET4 1.10 08/16/2021   FREET4 1.14 08/16/2020   FREET4 1.65 09/18/2019   FREET4 1.05 08/11/2018       Assessment & Plan:   1. Type 2 diabetes mellitus without complication, without long-term current use of insulin (HCC)  - Grant Torres has currently uncontrolled symptomatic type 2 DM since  62 years of age.  He presents with slight worsening of glycemic profile with point-of-care A1c of 7.8%.  He did not bring any meter nor logs with him.  He did have more or less stressful time between visits.  He continues to tolerate Jardiance  and metformin , and low-dose glipizide .    He presents with significant improvement in his glycemic profile.  Recent labs reviewed. - I had a long discussion with him about the progressive nature of diabetes and the pathology behind its complications.  -He does not report any gross complications from his diabetes, however he has comorbid conditions of hyperlipidemia, hypertension, and patient remains at a high risk for more acute and chronic complications which include CAD, CVA, CKD, retinopathy, and neuropathy. These are all discussed in detail with him.  - he acknowledges that there is a room for improvement in his food and drink choices. - Suggestion is made for him to avoid simple carbohydrates  from his diet including Cakes, Sweet Desserts, Ice Cream, Soda (diet and regular), Sweet Tea, Candies, Chips, Cookies, Store Bought Juices, Alcohol , Artificial Sweeteners,  Coffee Creamer, and Sugar-free Products, Lemonade. This will help patient to have more stable blood glucose profile and potentially avoid unintended weight gain.  The following Lifestyle Medicine recommendations according to  American College of Lifestyle Medicine  [facility]) were discussed and and offered to patient and he  agrees to start the journey:  A. Whole Foods, Plant-Based Nutrition comprising of fruits and vegetables, plant-based proteins, whole-grain carbohydrates was discussed in detail with the patient.   A list for source of those nutrients were also provided to the patient.  Patient will use only water  or unsweetened tea for hydration. B.  The need to stay away from risky substances including alcohol, smoking; obtaining 7 to 9 hours of restorative sleep, at least 150 minutes of moderate intensity exercise weekly, the importance of healthy social connections,  and stress management techniques were discussed. C.  A full color page of  Calorie density of various food groups per pound showing examples of each food groups was provided to the patient.   - he is scheduled with Penny Crumpton, RDN, CDE for individualized diabetes education.  - I have approached him with the following plan to manage  his diabetes and patient agrees:     He continues to tolerate his current medications including Jardiance  25 mg p.o. daily at breakfast, and metformin  500 mg p.o. twice daily.   - He will not need insulin intervention for now.  He will tolerate higher dose of glipizide .  I discussed and increase his glipizide  to 5 mg XL p.o. daily at breakfast.      He is advised to continue monitoring blood glucose twice a  day-daily before breakfast and at bedtime.   - he is encouraged to call clinic for blood glucose levels less than 70 or above 200 mg /dl. He is glutamic acid decarboxylase antibodies as well as IA 2 antibodies were negative.  His next labs will include zinc transporter antibodies to complete  workup for LADA.   - Specific targets for  A1c;  LDL, HDL,  and Triglycerides were discussed with the patient.  2) Blood Pressure /Hypertension:  -His blood pressure is controlled to target.  He is advised to lower his  enalapril  to 5 mg p.o. daily at breakfast   3) Lipids/Hyperlipidemia:   Review of his recent lipid panel showed controlled LDL at 85.  He is advised to continue lovastatin 10 mg p.o. daily at bedtime.   Side effects and precautions discussed with him.     4)  Weight/Diet:  Body mass index is 24.78 kg/m.  -   He is not a candidate for weight loss.  The above detailed  AC[facility] recommendations for nutrition, exercise, sleep, social life, avoidance of risky substances, the need for restorative sleep   information will also detailed on discharge instructions.   5) hypothyroidism: His thyroid  function tests are consistent with appropriate replacement.  He is advised to continue levothyroxine  50 mcg p.o. daily before breakfast.   - We discussed about the correct intake of his thyroid  hormone, on empty stomach at fasting, with water , separated by at least 30 minutes from breakfast and other medications,  and separated by more than 4 hours from calcium , iron, multivitamins, acid reflux medications (PPIs). -Patient is made aware of the fact that thyroid  hormone replacement is needed for life, dose to be adjusted by periodic monitoring of thyroid  function tests.   He did have a negative fine-needle aspiration biopsy of a thyroid  nodule in March 2017.  6) multinodular goiter: Review of his ultrasound between 2017-2019 documented multinodular goiter.  He does have one of his nodules biopsied with benign outcomes.  His previsit thyroid  ultrasound was negative for any new nodular lesions.   7) Chronic Care/Health Maintenance:  -he  is on ACEI/ARB and Statin medications and  is encouraged to initiate and continue to follow up with Ophthalmology, Dentist,  Podiatrist at least yearly or according to recommendations, and advised to   stay away from smoking. I have recommended yearly flu vaccine and pneumonia vaccine at least every 5 years; moderate intensity exercise for up to 150 minutes weekly; and  sleep for 7-  9 hours a day.  Regarding his concern the ED : He may benefit from low-dose Viagra .  A prescription for Viagra  25 mg p.o. as needed was given to him during his last visit.  - he is  advised to maintain close follow up with Cook, Jayce G, DO for primary care needs, as well as his other providers for optimal and coordinated care.   I spent  26  minutes in the care of the patient today including review of labs from Thyroid  Function, CMP, and other relevant labs ; imaging/biopsy records (current and previous including abstractions from other facilities); face-to-face time discussing  his lab results and symptoms, medications doses, his options of short and long term treatment based on the latest standards of care / guidelines;   and documenting the encounter.  Grant Torres  participated in the discussions, expressed understanding, and voiced agreement with the above plans.  All questions were answered to his satisfaction. he is encouraged to contact clinic should  he have any questions or concerns prior to his return visit.   Follow up plan: - Return in about 4 months (around 12/31/2024) for Fasting Labs  in AM B4 8.  Ranny Earl, MD [facility] [address] [place], [place] [address] Phone: [phone]  Fax: [phone]    09/02/2024, 9:02 AM  This note was partially dictated with voice recognition software. Similar sounding words can be transcribed inadequately or may not  be corrected upon review.

## 2024-09-15 ENCOUNTER — Other Ambulatory Visit: Payer: Self-pay | Admitting: "Endocrinology

## 2024-09-15 DIAGNOSIS — E038 Other specified hypothyroidism: Secondary | ICD-10-CM

## 2024-10-28 ENCOUNTER — Other Ambulatory Visit: Payer: Self-pay | Admitting: "Endocrinology

## 2024-11-13 ENCOUNTER — Other Ambulatory Visit: Payer: Self-pay | Admitting: "Endocrinology

## 2024-11-14 ENCOUNTER — Other Ambulatory Visit: Payer: Self-pay | Admitting: "Endocrinology

## 2024-12-31 ENCOUNTER — Ambulatory Visit: Admitting: "Endocrinology
